# Patient Record
Sex: Male | Born: 1965 | Race: Asian | Hispanic: No | Marital: Married | Smoking: Never smoker
Health system: Southern US, Community
[De-identification: ages and names within clinical notes are randomized; demographics above are authoritative.]

---

## 2009-11-29 ENCOUNTER — Emergency Department (HOSPITAL_COMMUNITY): Admission: EM | Admit: 2009-11-29 | Discharge: 2009-11-29 | Payer: Self-pay | Admitting: Emergency Medicine

## 2010-03-22 LAB — POCT URINALYSIS DIPSTICK
Bilirubin Urine: NEGATIVE
Glucose, UA: NEGATIVE mg/dL
Ketones, ur: NEGATIVE mg/dL
Specific Gravity, Urine: <=1.005 (ref 1.005–1.030)

## 2011-01-17 ENCOUNTER — Emergency Department (INDEPENDENT_AMBULATORY_CARE_PROVIDER_SITE_OTHER)
Admission: EM | Admit: 2011-01-17 | Discharge: 2011-01-17 | Disposition: A | Discharge status: Home / Self Care | Payer: Medicaid Other | Source: Home / Self Care | Attending: Family Medicine | Admitting: Family Medicine

## 2011-01-17 ENCOUNTER — Emergency Department (INDEPENDENT_AMBULATORY_CARE_PROVIDER_SITE_OTHER): Payer: Medicaid Other

## 2011-01-17 ENCOUNTER — Encounter: Payer: Self-pay | Admitting: *Deleted

## 2011-01-17 DIAGNOSIS — S62009A Unspecified fracture of navicular [scaphoid] bone of unspecified wrist, initial encounter for closed fracture: Secondary | ICD-10-CM

## 2011-01-17 MED ORDER — HYDROCODONE-ACETAMINOPHEN 5-325 MG PO TABS: ORAL_TABLET | ORAL | Status: AC

## 2011-01-17 NOTE — ED Notes (Signed)
Pt  reports  Yesterday  He  Larey Seat   And  Injured  His  l  Hand  There  Is  Swelling  Present          He  Has  Abrasion  Present to  The  Affected  Hand      And  He  Has pain     Present

## 2011-01-17 NOTE — ED Provider Notes (Signed)
History     CSN: [id number]  Arrival date & time 01/17/11  1644   First MD Initiated Contact with Patient 01/17/11 1651      Chief Complaint  Patient presents with  . Fall    (Consider location/radiation/quality/duration/timing/severity/associated sxs/prior treatment) HPI Comments: Kent Anderson presents for evaluation of pain and swelling in his LEFT hand and wrist. He reports falling down his stairs yesterday, striking his hand against the wall, but not falling on it. He reports persistent pain with movement of his wrist and hand. He denies any numbness, tingling, or weakness.   Patient is a 46 y.o. male presenting with hand injury. The history is provided by the patient. A language interpreter was used (His friend is interpreting for him).  Hand Injury  The incident occurred yesterday. The incident occurred at home. The injury mechanism was a fall. The pain is present in the left hand and left wrist. The pain is moderate. The pain has been constant since the incident. The symptoms are aggravated by movement, use and palpation.    History reviewed. No pertinent past medical history.  History reviewed. No pertinent past surgical history.  History reviewed. No pertinent family history.  History  Substance Use Topics  . Smoking status: Not on file  . Smokeless tobacco: Not on file  . Alcohol Use: Not on file      Review of Systems  Constitutional: Negative.   HENT: Negative.   Eyes: Negative.   Respiratory: Negative.   Cardiovascular: Negative.   Gastrointestinal: Negative.   Genitourinary: Negative.   Musculoskeletal: Positive for myalgias and arthralgias.       LEFT hand and wrist pain  Skin: Positive for wound.       Abrasions over dorsum of LEFT hand  Neurological: Negative.     Allergies  Review of patient's allergies indicates no known allergies.  Home Medications   Current Outpatient Rx  Name Route Sig Dispense Refill  . HYDROCODONE-ACETAMINOPHEN 5-325 MG PO TABS   Take one to two tablets every 4 to 6 hours as needed for pain 20 tablet 0    BP 132/70  Pulse 70  Temp(Src) 98.6 F (37 C) (Oral)  Resp 18  SpO2 100%  Physical Exam  Nursing note and vitals reviewed. Constitutional: He is oriented to person, place, and time. He appears well-developed and well-nourished.  HENT:  Head: Normocephalic and atraumatic.  Eyes: EOM are normal.  Neck: Normal range of motion.  Pulmonary/Chest: Effort normal.  Musculoskeletal: Normal range of motion.       Left wrist: He exhibits tenderness and bony tenderness. He exhibits normal range of motion and no deformity.       Arms:      Left hand: He exhibits tenderness and swelling. He exhibits normal range of motion, no bony tenderness, normal capillary refill and no deformity.  Neurological: He is alert and oriented to person, place, and time.  Skin: Skin is warm and dry. Abrasion noted.     Psychiatric: His behavior is normal.    ED Course  Procedures (including critical care time)  Labs Reviewed - No data to display Dg Wrist Complete Left  01/17/2011  *RADIOLOGY REPORT*  Clinical Data: Fall.  Pain.  LEFT WRIST - COMPLETE 3+ VIEW  Comparison: None.  Findings: On a single projection, possibility of subtle scaphoid waist fracture is raised.  IMPRESSION: On a single projection, possibility of subtle scaphoid waist fracture is raised  Original Report Authenticated By: Fuller Canada, M.D.  1. Scaphoid fracture of wrist       MDM  Xray reviewed by radiologist and myself; no acute findings or fracture        Richardo Priest, MD 01/17/11 2211

## 2011-05-11 ENCOUNTER — Encounter (HOSPITAL_COMMUNITY): Payer: Self-pay | Admitting: Emergency Medicine

## 2011-05-11 ENCOUNTER — Emergency Department (HOSPITAL_COMMUNITY)
Admission: EM | Admit: 2011-05-11 | Discharge: 2011-05-11 | Disposition: A | Discharge status: Home / Self Care | Payer: Medicaid Other | Source: Home / Self Care | Attending: Family Medicine | Admitting: Family Medicine

## 2011-05-11 DIAGNOSIS — R197 Diarrhea, unspecified: Secondary | ICD-10-CM

## 2011-05-11 DIAGNOSIS — R51 Headache: Secondary | ICD-10-CM

## 2011-05-11 MED ORDER — PROMETHAZINE-CODEINE 6.25-10 MG/5ML PO SYRP: 5.0000 mL | ORAL_SOLUTION | ORAL | Status: AC | PRN

## 2011-05-11 NOTE — ED Notes (Signed)
Gave patient warm blankets.

## 2011-05-11 NOTE — Discharge Instructions (Signed)
The medicine should calm you stomach and help your headache. Avoid caffeine and milk products. Follow the diet until normal stool. B.R.A.T. Diet Your doctor has recommended the B.R.A.T. diet for you or your child until the condition improves. This is often used to help control diarrhea and vomiting symptoms. If you or your child can tolerate clear liquids, you may have:  Bananas.   Rice.   Applesauce.   Toast (and other simple starches such as crackers, potatoes, noodles).  Be sure to avoid dairy products, meats, and fatty foods until symptoms are better. Fruit juices such as apple, grape, and prune juice can make diarrhea worse. Avoid these. Continue this diet for 2 days or as instructed by your caregiver. Document Released: 12/26/2004 Document Revised: 12/15/2010 Document Reviewed: 06/14/2006 [facility] Patient Information 2012 [place], [place].

## 2011-05-11 NOTE — ED Provider Notes (Signed)
History     CSN: [id number]  Arrival date & time 05/11/11  1228   First MD Initiated Contact with Patient 05/11/11 1454      Chief Complaint  Patient presents with  . Diarrhea    (Consider location/radiation/quality/duration/timing/severity/associated sxs/prior treatment) HPI Comments: The patient reports having a ha and diarrhea since yesterday. Has another family member here with similar symptoms that started 4 days ago. No blood in stool. Nausea but no vomiting. Thinks he has had fever but has not measured. No tx pta. No cough , no sore throat , no cold symptoms.   The history is provided by the patient.    History reviewed. No pertinent past medical history.  History reviewed. No pertinent past surgical history.  History reviewed. No pertinent family history.  History  Substance Use Topics  . Smoking status: Current Everyday Smoker  . Smokeless tobacco: Not on file  . Alcohol Use: Yes      Review of Systems  Constitutional: Negative.   Respiratory: Negative.   Cardiovascular: Negative.   Gastrointestinal: Positive for nausea and diarrhea. Negative for vomiting.  Musculoskeletal: Positive for arthralgias.  Skin: Negative.   Neurological: Positive for headaches. Negative for dizziness.    Allergies  Review of patient's allergies indicates no known allergies.  Home Medications   Current Outpatient Rx  Name Route Sig Dispense Refill  . PROMETHAZINE-CODEINE 6.25-10 MG/5ML PO SYRP Oral Take 5 mLs by mouth every 4 (four) hours as needed for cough. 120 mL 0    BP 115/78  Pulse 102  Temp(Src) 98.5 F (36.9 C) (Oral)  Resp 14  SpO2 100%  Physical Exam  Nursing note and vitals reviewed. Constitutional: He is oriented to person, place, and time. He appears well-developed and well-nourished. No distress.  HENT:  Head: Normocephalic and atraumatic.  Nose: Nose normal.  Mouth/Throat: No oropharyngeal exudate.  Neck: Normal range of motion. Neck supple.  Thyromegaly present.  Cardiovascular: Normal rate, regular rhythm and normal heart sounds.   Pulmonary/Chest: Effort normal and breath sounds normal.  Abdominal: Soft. Bowel sounds are normal. He exhibits no distension. There is no tenderness. There is no rebound and no guarding.  Lymphadenopathy:    He has no cervical adenopathy.  Neurological: He is alert and oriented to person, place, and time.  Skin: Skin is warm and dry.    ED Course  Procedures (including critical care time)  Labs Reviewed - No data to display No results found.   1. Diarrhea   2. Headache       MDM          Randa Spike, MD 05/11/11 1531

## 2011-05-11 NOTE — ED Notes (Signed)
Fever, diarrhea, abdominal pain, and headache.  Onset yesterday morning.  No vomiting

## 2015-08-19 ENCOUNTER — Encounter (HOSPITAL_COMMUNITY): Payer: Self-pay | Admitting: Emergency Medicine

## 2015-08-19 DIAGNOSIS — F17200 Nicotine dependence, unspecified, uncomplicated: Secondary | ICD-10-CM | POA: Insufficient documentation

## 2015-08-19 DIAGNOSIS — R1031 Right lower quadrant pain: Secondary | ICD-10-CM | POA: Diagnosis present

## 2015-08-19 DIAGNOSIS — K353 Acute appendicitis with localized peritonitis: Secondary | ICD-10-CM | POA: Diagnosis not present

## 2015-08-19 LAB — CBC
HCT: 44.3 % (ref 39.0–52.0)
Hemoglobin: 15.5 g/dL (ref 13.0–17.0)
MCH: 30.3 pg (ref 26.0–34.0)
MCHC: 35 g/dL (ref 30.0–36.0)
MCV: 86.7 fL (ref 78.0–100.0)
Platelets: 168 10*3/uL (ref 150–400)
RBC: 5.11 MIL/uL (ref 4.22–5.81)
RDW: 12 % (ref 11.5–15.5)
WBC: 7.9 10*3/uL (ref 4.0–10.5)

## 2015-08-19 LAB — URINALYSIS, ROUTINE W REFLEX MICROSCOPIC
BILIRUBIN URINE: NEGATIVE
Glucose, UA: NEGATIVE mg/dL
HGB URINE DIPSTICK: NEGATIVE
Ketones, ur: NEGATIVE mg/dL
Leukocytes, UA: NEGATIVE
Nitrite: NEGATIVE
Protein, ur: NEGATIVE mg/dL
SPECIFIC GRAVITY, URINE: 1.016 (ref 1.005–1.030)
pH: 7 (ref 5.0–8.0)

## 2015-08-19 NOTE — ED Triage Notes (Signed)
Pt. reports mid/low abdominal pain onset yesterday , denies emesis or diarrhea , no fever or chills.

## 2015-08-20 ENCOUNTER — Encounter (HOSPITAL_COMMUNITY): Payer: Self-pay | Admitting: General Practice

## 2015-08-20 ENCOUNTER — Observation Stay (HOSPITAL_COMMUNITY)
Admission: EM | Admit: 2015-08-20 | Discharge: 2015-08-21 | Disposition: A | Discharge status: Home / Self Care | Payer: BLUE CROSS/BLUE SHIELD | Attending: Surgery | Admitting: Surgery

## 2015-08-20 ENCOUNTER — Observation Stay (HOSPITAL_COMMUNITY): Payer: BLUE CROSS/BLUE SHIELD | Admitting: Anesthesiology

## 2015-08-20 ENCOUNTER — Encounter (HOSPITAL_COMMUNITY)
Admission: EM | Disposition: A | Discharge status: Home / Self Care | Payer: Self-pay | Source: Home / Self Care | Attending: Emergency Medicine

## 2015-08-20 ENCOUNTER — Emergency Department (HOSPITAL_COMMUNITY): Payer: BLUE CROSS/BLUE SHIELD

## 2015-08-20 DIAGNOSIS — K3580 Unspecified acute appendicitis: Secondary | ICD-10-CM | POA: Diagnosis present

## 2015-08-20 HISTORY — PX: LAPAROSCOPIC APPENDECTOMY: SHX408

## 2015-08-20 HISTORY — PX: APPENDECTOMY: SHX54

## 2015-08-20 LAB — COMPREHENSIVE METABOLIC PANEL
ALT: 34 U/L (ref 17–63)
ANION GAP: 7 (ref 5–15)
AST: 24 U/L (ref 15–41)
Albumin: 3.9 g/dL (ref 3.5–5.0)
Alkaline Phosphatase: 37 U/L — ABNORMAL LOW (ref 38–126)
BILIRUBIN TOTAL: 1 mg/dL (ref 0.3–1.2)
BUN: 8 mg/dL (ref 6–20)
CHLORIDE: 102 mmol/L (ref 101–111)
CO2: 24 mmol/L (ref 22–32)
Calcium: 9.6 mg/dL (ref 8.9–10.3)
Creatinine, Ser: 1.04 mg/dL (ref 0.61–1.24)
Glucose, Bld: 152 mg/dL — ABNORMAL HIGH (ref 65–99)
POTASSIUM: 3.5 mmol/L (ref 3.5–5.1)
Sodium: 133 mmol/L — ABNORMAL LOW (ref 135–145)
TOTAL PROTEIN: 7.1 g/dL (ref 6.5–8.1)

## 2015-08-20 LAB — LIPASE, BLOOD: LIPASE: 34 U/L (ref 11–51)

## 2015-08-20 SURGERY — APPENDECTOMY, LAPAROSCOPIC
Anesthesia: General | Site: Abdomen

## 2015-08-20 MED ORDER — OXYCODONE HCL 5 MG/5ML PO SOLN: 5.0000 mg | Freq: Once | ORAL | Status: DC | PRN

## 2015-08-20 MED ORDER — METRONIDAZOLE IN NACL 5-0.79 MG/ML-% IV SOLN: 500.0000 mg | Freq: Three times a day (TID) | INTRAVENOUS | Status: DC

## 2015-08-20 MED ORDER — DIPHENHYDRAMINE HCL 50 MG/ML IJ SOLN: 12.5000 mg | Freq: Four times a day (QID) | INTRAMUSCULAR | Status: DC | PRN

## 2015-08-20 MED ORDER — DEXTROSE 5 % IV SOLN: 2.0000 g | INTRAVENOUS | Status: DC

## 2015-08-20 MED ORDER — ROCURONIUM BROMIDE 100 MG/10ML IV SOLN
INTRAVENOUS | Status: DC | PRN
  Administered 2015-08-20: 50 mg via INTRAVENOUS

## 2015-08-20 MED ORDER — LIDOCAINE 2% (20 MG/ML) 5 ML SYRINGE
INTRAMUSCULAR | Status: AC
  Filled 2015-08-20: qty 5

## 2015-08-20 MED ORDER — ONDANSETRON HCL 4 MG/2ML IJ SOLN
4.0000 mg | Freq: Once | INTRAMUSCULAR | Status: AC
  Administered 2015-08-20: 4 mg via INTRAVENOUS
  Filled 2015-08-20: qty 2

## 2015-08-20 MED ORDER — ONDANSETRON HCL 4 MG/2ML IJ SOLN: 4.0000 mg | Freq: Four times a day (QID) | INTRAMUSCULAR | Status: DC | PRN

## 2015-08-20 MED ORDER — OXYCODONE HCL 5 MG PO TABS
5.0000 mg | ORAL_TABLET | ORAL | Status: DC | PRN
  Administered 2015-08-20 – 2015-08-21 (×3): 10 mg via ORAL
  Filled 2015-08-20 (×3): qty 2

## 2015-08-20 MED ORDER — FAMOTIDINE IN NACL 20-0.9 MG/50ML-% IV SOLN
20.0000 mg | Freq: Two times a day (BID) | INTRAVENOUS | Status: DC
  Administered 2015-08-20: 20 mg via INTRAVENOUS
  Filled 2015-08-20 (×3): qty 50

## 2015-08-20 MED ORDER — 0.9 % SODIUM CHLORIDE (POUR BTL) OPTIME
TOPICAL | Status: DC | PRN
  Administered 2015-08-20: 1000 mL

## 2015-08-20 MED ORDER — SUGAMMADEX SODIUM 200 MG/2ML IV SOLN
INTRAVENOUS | Status: DC | PRN
  Administered 2015-08-20: 200 mg via INTRAVENOUS

## 2015-08-20 MED ORDER — SODIUM CHLORIDE 0.9 % IR SOLN
Status: DC | PRN
  Administered 2015-08-20: 1000 mL

## 2015-08-20 MED ORDER — HYDROMORPHONE HCL 1 MG/ML IJ SOLN: 0.2500 mg | INTRAMUSCULAR | Status: DC | PRN

## 2015-08-20 MED ORDER — FENTANYL CITRATE (PF) 250 MCG/5ML IJ SOLN
INTRAMUSCULAR | Status: AC
  Filled 2015-08-20: qty 5

## 2015-08-20 MED ORDER — LIDOCAINE HCL (CARDIAC) 20 MG/ML IV SOLN
INTRAVENOUS | Status: DC | PRN
  Administered 2015-08-20: 100 mg via INTRAVENOUS

## 2015-08-20 MED ORDER — CEFTRIAXONE SODIUM 2 G IJ SOLR
2.0000 g | INTRAMUSCULAR | Status: DC
  Administered 2015-08-21: 2 g via INTRAVENOUS
  Filled 2015-08-20: qty 2

## 2015-08-20 MED ORDER — DEXTROSE 5 % IV SOLN
2.0000 g | Freq: Once | INTRAVENOUS | Status: AC
  Administered 2015-08-20: 2 g via INTRAVENOUS
  Filled 2015-08-20: qty 2

## 2015-08-20 MED ORDER — OXYCODONE HCL 5 MG PO TABS: 5.0000 mg | ORAL_TABLET | Freq: Once | ORAL | Status: DC | PRN

## 2015-08-20 MED ORDER — ROCURONIUM BROMIDE 10 MG/ML (PF) SYRINGE
PREFILLED_SYRINGE | INTRAVENOUS | Status: AC
  Filled 2015-08-20: qty 10

## 2015-08-20 MED ORDER — SUGAMMADEX SODIUM 200 MG/2ML IV SOLN
INTRAVENOUS | Status: AC
  Filled 2015-08-20: qty 2

## 2015-08-20 MED ORDER — ONDANSETRON 4 MG PO TBDP: 4.0000 mg | ORAL_TABLET | Freq: Four times a day (QID) | ORAL | Status: DC | PRN

## 2015-08-20 MED ORDER — ONDANSETRON HCL 4 MG/2ML IJ SOLN
INTRAMUSCULAR | Status: DC | PRN
  Administered 2015-08-20: 4 mg via INTRAVENOUS

## 2015-08-20 MED ORDER — PROPOFOL 10 MG/ML IV BOLUS
INTRAVENOUS | Status: DC | PRN
  Administered 2015-08-20: 200 mg via INTRAVENOUS

## 2015-08-20 MED ORDER — BUPIVACAINE-EPINEPHRINE 0.25% -1:200000 IJ SOLN
INTRAMUSCULAR | Status: DC | PRN
  Administered 2015-08-20: 15 mL

## 2015-08-20 MED ORDER — FENTANYL CITRATE (PF) 100 MCG/2ML IJ SOLN
50.0000 ug | Freq: Once | INTRAMUSCULAR | Status: AC
  Administered 2015-08-20: 50 ug via INTRAVENOUS
  Filled 2015-08-20: qty 2

## 2015-08-20 MED ORDER — DIPHENHYDRAMINE HCL 12.5 MG/5ML PO ELIX: 12.5000 mg | ORAL_SOLUTION | Freq: Four times a day (QID) | ORAL | Status: DC | PRN

## 2015-08-20 MED ORDER — ENOXAPARIN SODIUM 40 MG/0.4ML ~~LOC~~ SOLN
40.0000 mg | SUBCUTANEOUS | Status: DC
  Filled 2015-08-20: qty 0.4

## 2015-08-20 MED ORDER — METRONIDAZOLE IN NACL 5-0.79 MG/ML-% IV SOLN
500.0000 mg | Freq: Three times a day (TID) | INTRAVENOUS | Status: DC
  Administered 2015-08-20: 500 mg via INTRAVENOUS

## 2015-08-20 MED ORDER — PROPOFOL 10 MG/ML IV BOLUS
INTRAVENOUS | Status: AC
  Filled 2015-08-20: qty 20

## 2015-08-20 MED ORDER — IOPAMIDOL (ISOVUE-300) INJECTION 61%
INTRAVENOUS | Status: AC
  Administered 2015-08-20: 100 mL
  Filled 2015-08-20: qty 100

## 2015-08-20 MED ORDER — HYDROMORPHONE HCL 1 MG/ML IJ SOLN: 1.0000 mg | INTRAMUSCULAR | Status: DC | PRN

## 2015-08-20 MED ORDER — LACTATED RINGERS IV SOLN
INTRAVENOUS | Status: DC
Start: 2015-08-20 — End: 2015-08-21
  Administered 2015-08-20 (×2): via INTRAVENOUS
  Administered 2015-08-20: 100 mL/h via INTRAVENOUS
  Administered 2015-08-21: 05:00:00 via INTRAVENOUS

## 2015-08-20 MED ORDER — MIDAZOLAM HCL 5 MG/5ML IJ SOLN
INTRAMUSCULAR | Status: DC | PRN
  Administered 2015-08-20: 1 mg via INTRAVENOUS

## 2015-08-20 MED ORDER — METRONIDAZOLE IN NACL 5-0.79 MG/ML-% IV SOLN
500.0000 mg | Freq: Once | INTRAVENOUS | Status: AC
  Administered 2015-08-20: 500 mg via INTRAVENOUS
  Filled 2015-08-20: qty 100

## 2015-08-20 MED ORDER — FENTANYL CITRATE (PF) 100 MCG/2ML IJ SOLN
INTRAMUSCULAR | Status: DC | PRN
  Administered 2015-08-20 (×2): 50 ug via INTRAVENOUS

## 2015-08-20 MED ORDER — MIDAZOLAM HCL 2 MG/2ML IJ SOLN
INTRAMUSCULAR | Status: AC
Start: 2015-08-20 — End: 2015-08-20
  Filled 2015-08-20: qty 2

## 2015-08-20 MED ORDER — BUPIVACAINE-EPINEPHRINE (PF) 0.25% -1:200000 IJ SOLN
INTRAMUSCULAR | Status: AC
  Filled 2015-08-20: qty 30

## 2015-08-20 MED ORDER — ONDANSETRON HCL 4 MG/2ML IJ SOLN: 4.0000 mg | Freq: Once | INTRAMUSCULAR | Status: DC | PRN

## 2015-08-20 MED ORDER — METRONIDAZOLE IN NACL 5-0.79 MG/ML-% IV SOLN
500.0000 mg | Freq: Three times a day (TID) | INTRAVENOUS | Status: AC
  Administered 2015-08-20: 500 mg via INTRAVENOUS
  Filled 2015-08-20 (×2): qty 100

## 2015-08-20 MED ORDER — MORPHINE SULFATE (PF) 2 MG/ML IV SOLN
1.0000 mg | INTRAVENOUS | Status: DC | PRN
  Administered 2015-08-20: 2 mg via INTRAVENOUS
  Filled 2015-08-20: qty 1

## 2015-08-20 MED ORDER — ACETAMINOPHEN 500 MG PO TABS
1000.0000 mg | ORAL_TABLET | Freq: Four times a day (QID) | ORAL | Status: DC
  Administered 2015-08-20 – 2015-08-21 (×2): 1000 mg via ORAL
  Filled 2015-08-20 (×2): qty 2

## 2015-08-20 MED ORDER — ONDANSETRON HCL 4 MG/2ML IJ SOLN
INTRAMUSCULAR | Status: AC
  Filled 2015-08-20: qty 2

## 2015-08-20 MED ORDER — MEPERIDINE HCL 25 MG/ML IJ SOLN: 6.2500 mg | INTRAMUSCULAR | Status: DC | PRN

## 2015-08-20 MED ORDER — METRONIDAZOLE IN NACL 5-0.79 MG/ML-% IV SOLN
500.0000 mg | INTRAVENOUS | Status: AC
  Filled 2015-08-20: qty 100

## 2015-08-20 SURGICAL SUPPLY — 51 items
APPLIER CLIP ROT 10 11.4 M/L (STAPLE)
BANDAGE ADH SHEER 1  50/CT (GAUZE/BANDAGES/DRESSINGS) ×3 IMPLANT
BENZOIN TINCTURE PRP APPL 2/3 (GAUZE/BANDAGES/DRESSINGS) ×3 IMPLANT
BLADE SURG ROTATE 9660 (MISCELLANEOUS) IMPLANT
CANISTER SUCTION 2500CC (MISCELLANEOUS) ×3 IMPLANT
CHLORAPREP W/TINT 26ML (MISCELLANEOUS) ×3 IMPLANT
CLIP APPLIE ROT 10 11.4 M/L (STAPLE) IMPLANT
CLOSURE WOUND 1/2 X4 (GAUZE/BANDAGES/DRESSINGS) ×1
COVER SURGICAL LIGHT HANDLE (MISCELLANEOUS) ×3 IMPLANT
CUTTER FLEX LINEAR 45M (STAPLE) ×3 IMPLANT
DERMABOND ADVANCED (GAUZE/BANDAGES/DRESSINGS)
DERMABOND ADVANCED .7 DNX12 (GAUZE/BANDAGES/DRESSINGS) IMPLANT
DRSG TEGADERM 2-3/8X2-3/4 SM (GAUZE/BANDAGES/DRESSINGS) ×3 IMPLANT
DRSG TEGADERM 4X4.75 (GAUZE/BANDAGES/DRESSINGS) IMPLANT
ELECT REM PT RETURN 9FT ADLT (ELECTROSURGICAL) ×3
ELECTRODE REM PT RTRN 9FT ADLT (ELECTROSURGICAL) ×1 IMPLANT
ENDOLOOP SUT PDS II  0 18 (SUTURE)
ENDOLOOP SUT PDS II 0 18 (SUTURE) IMPLANT
GAUZE SPONGE 2X2 8PLY STRL LF (GAUZE/BANDAGES/DRESSINGS) ×1 IMPLANT
GLOVE BIO SURGEON STRL SZ7 (GLOVE) ×3 IMPLANT
GLOVE BIO SURGEON STRL SZ7.5 (GLOVE) ×3 IMPLANT
GLOVE BIOGEL M STRL SZ7.5 (GLOVE) ×3 IMPLANT
GLOVE BIOGEL PI IND STRL 8 (GLOVE) ×1 IMPLANT
GLOVE BIOGEL PI INDICATOR 8 (GLOVE) ×2
GOWN STRL REUS W/ TWL LRG LVL3 (GOWN DISPOSABLE) ×2 IMPLANT
GOWN STRL REUS W/ TWL XL LVL3 (GOWN DISPOSABLE) ×1 IMPLANT
GOWN STRL REUS W/TWL LRG LVL3 (GOWN DISPOSABLE) ×4
GOWN STRL REUS W/TWL XL LVL3 (GOWN DISPOSABLE) ×2
KIT BASIN OR (CUSTOM PROCEDURE TRAY) ×3 IMPLANT
KIT ROOM TURNOVER OR (KITS) ×3 IMPLANT
NS IRRIG 1000ML POUR BTL (IV SOLUTION) ×3 IMPLANT
PAD ARMBOARD 7.5X6 YLW CONV (MISCELLANEOUS) ×6 IMPLANT
POUCH RETRIEVAL ECOSAC 10 (ENDOMECHANICALS) ×1 IMPLANT
POUCH RETRIEVAL ECOSAC 10MM (ENDOMECHANICALS) ×2
RELOAD 45 VASCULAR/THIN (ENDOMECHANICALS) IMPLANT
RELOAD STAPLE TA45 3.5 REG BLU (ENDOMECHANICALS) ×3 IMPLANT
SCALPEL HARMONIC ACE (MISCELLANEOUS) ×3 IMPLANT
SCISSORS LAP 5X35 DISP (ENDOMECHANICALS) IMPLANT
SET IRRIG TUBING LAPAROSCOPIC (IRRIGATION / IRRIGATOR) ×3 IMPLANT
SPECIMEN JAR SMALL (MISCELLANEOUS) ×3 IMPLANT
SPONGE GAUZE 2X2 STER 10/PKG (GAUZE/BANDAGES/DRESSINGS) ×2
STRIP CLOSURE SKIN 1/2X4 (GAUZE/BANDAGES/DRESSINGS) ×2 IMPLANT
SUT MNCRL AB 4-0 PS2 18 (SUTURE) ×3 IMPLANT
SUT VICRYL 0 UR6 27IN ABS (SUTURE) IMPLANT
TOWEL OR 17X24 6PK STRL BLUE (TOWEL DISPOSABLE) ×3 IMPLANT
TOWEL OR 17X26 10 PK STRL BLUE (TOWEL DISPOSABLE) ×3 IMPLANT
TRAY FOLEY CATH 16FR SILVER (SET/KITS/TRAYS/PACK) ×3 IMPLANT
TRAY LAPAROSCOPIC MC (CUSTOM PROCEDURE TRAY) ×3 IMPLANT
TROCAR XCEL BLADELESS 5X75MML (TROCAR) ×6 IMPLANT
TROCAR XCEL BLUNT TIP 100MML (ENDOMECHANICALS) ×3 IMPLANT
TUBING INSUFFLATION (TUBING) ×3 IMPLANT

## 2015-08-20 NOTE — Transfer of Care (Signed)
Immediate Anesthesia Transfer of Care Note  Patient: Kent Anderson  Procedure(s) Performed: Procedure(s): APPENDECTOMY LAPAROSCOPIC (N/A)  Patient Location: PACU  Anesthesia Type:General  Level of Consciousness: awake and alert   Airway & Oxygen Therapy: Patient Spontanous Breathing and Patient connected to face mask oxygen  Post-op Assessment: Report given to RN, Post -op Vital signs reviewed and stable and Patient moving all extremities  Post vital signs: Reviewed and stable  Last Vitals:  Vitals:   08/20/15 1108 08/20/15 1452  BP: 121/98 (P) 132/80  Pulse: 75   Resp: 18 (!) (P) 22  Temp:  (P) 36.7 C    Last Pain:  Vitals:   08/20/15 1108  TempSrc:   PainSc: 4          Complications: No apparent anesthesia complications

## 2015-08-20 NOTE — ED Notes (Addendum)
Pt ambulatory to and from bathroom to void, tolerated well.

## 2015-08-20 NOTE — ED Notes (Signed)
Pt to OR, daughter is accompanying pt to interpret.

## 2015-08-20 NOTE — Anesthesia Procedure Notes (Signed)
Procedure Name: Intubation Date/Time: 08/20/2015 1:49 PM Performed by: Fransisca KaufmannMEYER, Claritza July E Pre-anesthesia Checklist: Patient identified, Emergency Drugs available, Suction available and Patient being monitored Patient Re-evaluated:Patient Re-evaluated prior to inductionOxygen Delivery Method: Circle System Utilized Preoxygenation: Pre-oxygenation with 100% oxygen Intubation Type: IV induction Ventilation: Mask ventilation without difficulty Laryngoscope Size: Miller and 2 Grade View: Grade I Tube type: Oral Tube size: 7.0 mm Number of attempts: 1 Airway Equipment and Method: Stylet and Oral airway Placement Confirmation: ETT inserted through vocal cords under direct vision,  positive ETCO2 and breath sounds checked- equal and bilateral Secured at: 22 cm Tube secured with: Tape Dental Injury: Teeth and Oropharynx as per pre-operative assessment

## 2015-08-20 NOTE — Anesthesia Postprocedure Evaluation (Signed)
Anesthesia Post Note  Patient: Kent Anderson  Procedure(s) Performed: Procedure(s) (LRB): APPENDECTOMY LAPAROSCOPIC (N/A)  Patient location during evaluation: PACU Anesthesia Type: General Level of consciousness: awake and alert Pain management: pain level controlled Vital Signs Assessment: post-procedure vital signs reviewed and stable Respiratory status: spontaneous breathing, nonlabored ventilation and respiratory function stable Cardiovascular status: blood pressure returned to baseline and stable Postop Assessment: no signs of nausea or vomiting Anesthetic complications: no    Last Vitals:  Vitals:   08/20/15 1500 08/20/15 1515  BP: (!) 147/103 (!) 149/97  Pulse: 97 88  Resp: (!) 22 (!) 22  Temp:      Last Pain:  Vitals:   08/20/15 1452  TempSrc:   PainSc: Asleep                 Dorthy Magnussen A

## 2015-08-20 NOTE — ED Notes (Signed)
Belongings given to daughter  

## 2015-08-20 NOTE — Op Note (Signed)
Kent Anderson [id number] 06-Aug-1965 08/20/2015  Appendectomy, Lap, Procedure Note  Indications: The patient presented with a history of right-sided abdominal pain. A CT revealed findings consistent with acute appendicitis.  Pre-operative Diagnosis: acute appendicitis  Post-operative Diagnosis: Same  Surgeon: Atilano Ina   Assistants: Gray Bernhardt, RNFA  Anesthesia: General endotracheal anesthesia  Procedure Details  The patient was seen again in the Holding Room. The risks, benefits, complications, treatment options, and expected outcomes were discussed with the patient and/or family. The possibilities of perforation of viscus, bleeding, recurrent infection, the need for additional procedures, failure to diagnose a condition, and creating a complication requiring transfusion or operation were discussed. There was concurrence with the proposed plan and informed consent was obtained. The site of surgery was properly noted. The patient was taken to Operating Room, identified as Kent Anderson and the procedure verified as Appendectomy. A Time Out was held and the above information confirmed.  The patient was placed in the supine position and general anesthesia was induced, along with placement of orogastric tube, SCDs, and a Foley catheter. The abdomen was prepped and draped in a sterile fashion. A 1.5 centimeter infraumbilical incision was made.  The umbilical stalk was elevated, and the midline fascia was incised with a #11 blade.  A Kelly clamp was used to confirm entrance into the peritoneal cavity.  A pursestring suture was passed around the incision with a 0 Vicryl.  A 12mm Hasson was introduced into the abdomen and the tails of the suture were used to hold the Hasson in place.   The pneumoperitoneum was then established to steady pressure of 15 mmHg.  Additional 5 mm cannulas then placed in the left lower quadrant of the abdomen and the suprapubic region under direct visualization. A careful  evaluation of the entire abdomen was carried out. The patient was placed in Trendelenburg and left lateral decubitus position. The small intestines were retracted in the cephalad and left lateral direction away from the pelvis and right lower quadrant. The patient was found to have an inflammed appendix that was extending into the pelvis. There was no evidence of perforation. There was some light brown tinged fluid in the pelvix  The appendix was carefully dissected. The appendix was was skeletonized with the harmonic scalpel.   The appendix was divided at its base using an endo-GIA stapler with a blue load. No appendiceal stump was left in place. Staple line was inspected and was intact with 2 rows of staples. The appendix was removed from the abdomen with an Ecco bag through the umbilical port.  There was no evidence of bleeding, leakage, or complication after division of the appendix. Irrigation was also performed and irrigate suctioned from the abdomen as well.  The umbilical port site was closed with the purse string suture. The closure was viewed laparoscopically. There was no residual palpable fascial defect.  The trocar site skin wounds were closed with 4-0 Monocryl. Benzoin, steri-strips, bandages applied to the skin incisions.  Instrument, sponge, and needle counts were correct at the conclusion of the case.   Findings: The appendix was found to be inflamed. There were not signs of necrosis.  There was not perforation. There was not abscess formation.  Estimated Blood Loss:  Minimal         Drains: none         Specimens: appendix         Complications:  None; patient tolerated the procedure well.         Disposition:  PACU - hemodynamically stable.         Condition: stable  Mary SellaEric M. Andrey CampanileWilson, MD, FACS General, Bariatric, & Minimally Invasive Surgery [facility], [place]

## 2015-08-20 NOTE — Anesthesia Preprocedure Evaluation (Signed)
Anesthesia Evaluation  Patient identified by MRN, date of birth, ID band Patient awake    Reviewed: Allergy & Precautions, NPO status , Patient's Chart, lab work & pertinent test results  Airway Mallampati: I  TM Distance: >3 FB Neck ROM: Full    Dental  (+) Teeth Intact, Dental Advisory Given   Pulmonary Current Smoker,    breath sounds clear to auscultation       Cardiovascular  Rhythm:Regular Rate:Normal     Neuro/Psych    GI/Hepatic   Endo/Other    Renal/GU      Musculoskeletal   Abdominal   Peds  Hematology   Anesthesia Other Findings   Reproductive/Obstetrics                             Anesthesia Physical Anesthesia Plan  ASA: II  Anesthesia Plan: General   Post-op Pain Management:    Induction: Intravenous  Airway Management Planned: Oral ETT  Additional Equipment:   Intra-op Plan:   Post-operative Plan: Extubation in OR  Informed Consent: I have reviewed the patients History and Physical, chart, labs and discussed the procedure including the risks, benefits and alternatives for the proposed anesthesia with the patient or authorized representative who has indicated his/her understanding and acceptance.   Dental advisory given  Plan Discussed with: CRNA, Anesthesiologist and Surgeon  Anesthesia Plan Comments:         Anesthesia Quick Evaluation

## 2015-08-20 NOTE — ED Provider Notes (Signed)
[facility]-EMERGENCY DEPT Provider Note   CSN: [id number] Arrival date & time: 08/19/15  2306  First Provider Contact:   First MD Initiated Contact with Patient 08/20/15 0331     By signing my name below, I, Soijett Blue, attest that this documentation has been prepared under the direction and in the presence of Zadie Rhineonald Mischelle Reeg, MD. Electronically Signed: Soijett Blue, ED Scribe. 08/20/15. 3:50 AM.    History   Chief Complaint Chief Complaint  Patient presents with  . Abdominal Pain    HPI Kent Anderson is a 50 y.o. male who presents to the Emergency Department complaining of right lower abdominal pain onset 2 days. He states that he has tried OTC medications with no relief for his symptoms. He denies fever, vomiting, CP, SOB, difficulty urinating, back pain, and any other symptoms. Denies having abdominal surgery in the past.   The history is provided by the patient. A language interpreter was used [place]ali).  Abdominal Pain   This is a new problem. The current episode started 2 days ago. The problem occurs rarely. The problem has not changed since onset.The pain is associated with an unknown factor. The pain is moderate. Pertinent negatives include fever and vomiting. Nothing aggravates the symptoms. Nothing relieves the symptoms. Past workup does not include surgery.    History reviewed. No pertinent past medical history.  There are no active problems to display for this patient.   History reviewed. No pertinent surgical history.     Home Medications    Prior to Admission medications   Not on File    Family History No family history on file.  Social History Social History  Substance Use Topics  . Smoking status: Current Every Day Smoker  . Smokeless tobacco: Not on file  . Alcohol use Yes     Allergies   Review of patient's allergies indicates no known allergies.   Review of Systems Review of Systems  Constitutional: Negative for fever.  Gastrointestinal: Positive  for abdominal pain. Negative for vomiting.  All other systems reviewed and are negative.    Physical Exam Updated Vital Signs BP 122/99   Pulse 73   Temp 98 F (36.7 C) (Oral)   Resp 21   Ht 5\' 7"  (1.702 m)   Wt 174 lb (78.9 kg)   SpO2 99%   BMI 27.25 kg/m   Physical Exam  CONSTITUTIONAL: Well developed/well nourished HEAD: Normocephalic/atraumatic EYES: EOMI/PERRL ENMT: Mucous membranes moist NECK: supple no meningeal signs SPINE/BACK:entire spine nontender CV: S1/S2 noted, no murmurs/rubs/gallops noted LUNGS: Lungs are clear to auscultation bilaterally, no apparent distress ABDOMEN: soft, moderate RLQ tenderness, no rebound or guarding, bowel sounds noted throughout abdomen GU:no cva tenderness. No inguinal hernia noted. Chaperone present. NEURO: Pt is awake/alert/appropriate, moves all extremitiesx4.  No facial droop.   EXTREMITIES: pulses normal/equal, full ROM SKIN: warm, color normal PSYCH: no abnormalities of mood noted, alert and oriented to situation   ED Treatments / Results  DIAGNOSTIC STUDIES: Oxygen Saturation is 100% on RA, nl by my interpretation.    COORDINATION OF CARE: 3:45 AM Discussed treatment plan with pt at bedside which includes labs, UA, CT abdomen pelvis, fentanyl, zofran, and pt agreed to plan.   Labs (all labs ordered are listed, but only abnormal results are displayed) Labs Reviewed  COMPREHENSIVE METABOLIC PANEL - Abnormal; Notable for the following:       Result Value   Sodium 133 (*)    Glucose, Bld 152 (*)    Alkaline Phosphatase 37 (*)  All other components within normal limits  LIPASE, BLOOD  CBC  URINALYSIS, ROUTINE W REFLEX MICROSCOPIC (NOT AT [facility])    EKG  EKG Interpretation None       Radiology Ct Abdomen Pelvis W Contrast  Result Date: 08/20/2015 CLINICAL DATA:  Initial evaluation for acute right lower quadrant abdominal pain for 2 days. EXAM: CT ABDOMEN AND PELVIS WITH CONTRAST TECHNIQUE: Multidetector CT  imaging of the abdomen and pelvis was performed using the standard protocol following bolus administration of intravenous contrast. CONTRAST:  ISOVUE-300 IOPAMIDOL (ISOVUE-300) INJECTION 61% COMPARISON:  None. FINDINGS: Mild subsegmental atelectasis seen dependently within the visualized lung bases. Visualized lungs are otherwise clear. 5 mm subpleural nodule at the left lung base noted (series 205, image 15), indeterminate. Liver demonstrates a normal contrast enhanced appearance. Gallbladder within normal limits. No biliary dilatation. Spleen, adrenal glands, and pancreas demonstrate a normal contrast enhanced appearance. Kidneys are equal in size with symmetric enhancement. No nephrolithiasis, hydronephrosis, or focal enhancing renal mass. Stomach within normal limits. No evidence for bowel obstruction. Appendix visualized in the right lower quadrant and is dilated up to 12 mm with associated mucosal enhancement and inflammatory periappendiceal fat stranding, consistent with acute appendicitis. No evidence for perforation. No periappendiceal abscess. No other acute inflammatory changes about the bowels. Bladder well distended and normal in appearance. Prostate within normal limits. No free intraperitoneal air. Trace free fluid within the Roux right pericolic gutter related to the inflamed appendix. No pathologically enlarged intra-abdominal or pelvic lymph nodes. Normal intravascular enhancement seen throughout the intra-abdominal aorta and its branch vessels. No acute osseous abnormality. No worrisome lytic or blastic osseous lesions. Moderate degenerative spondylolysis noted at L4-5. IMPRESSION: 1. Findings consistent with acute appendicitis. No evidence for perforation or other complication. 2. 5 mm subpleural nodule within the left lung base, indeterminate. No follow-up needed if patient is low-risk. Non-contrast chest CT can be considered in 12 months if patient is high-risk. This recommendation  follows the consensus statement: Guidelines for Management of Incidental Pulmonary Nodules Detected on CT Images:From the Fleischner Society 2017; published online before print (10.1148/radiol.[id number]). Electronically Signed   By: Rise Mu M.D.   On: 08/20/2015 05:44    Procedures Procedures (including critical care time)  Medications Ordered in ED Medications  metroNIDAZOLE (FLAGYL) IVPB 500 mg (not administered)  cefTRIAXone (ROCEPHIN) 2 g in dextrose 5 % 50 mL IVPB (not administered)  fentaNYL (SUBLIMAZE) injection 50 mcg (not administered)  fentaNYL (SUBLIMAZE) injection 50 mcg (50 mcg Intravenous Given 08/20/15 0355)  ondansetron (ZOFRAN) injection 4 mg (4 mg Intravenous Given 08/20/15 0355)  iopamidol (ISOVUE-300) 61 % injection (100 mLs  Contrast Given 08/20/15 0446)     Initial Impression / Assessment and Plan / ED Course  I have reviewed the triage vital signs and the nursing notes.  Pertinent labs  results that were available during my care of the patient were reviewed by me and considered in my medical decision making (see chart for details).  Clinical Course     Interpreter number [phone] (nepali interpreter) Pt with abdominal pain CT imaging reveals acute appendicitis Pt stable D/w dr Janee Morn with gen surgery Will admit He requests flagyll and rocephin pre-op   Final Clinical Impressions(s) / ED Diagnoses   Final diagnoses:  Acute appendicitis, unspecified acute appendicitis type    New Prescriptions New Prescriptions   No medications on file   I personally performed the services described in this documentation, which was scribed in my presence. The recorded information has  been reviewed and is accurate.        Zadie Rhine, MD 08/20/15 [phone]

## 2015-08-20 NOTE — H&P (Signed)
[facility] Admission Note  Kent Anderson 09/06/1965  [id number].    Requesting MD: Dr. Ripley Fraise Chief Complaint/Reason for Consult: abdominal pain HPI:  50 y/o [place] male with no significant PMH who presented to [facility] with RLQ pain that started 2 days ago. The abdominal pain is described as sharp. Pain was peri-umbilical and then migrated to the RLQ. Initially at 10/10 and is now a 5/10 after receiving meds in ED. Patient states nothing makes the pain better or worse - he has tried taking an OTC medication from [facility]. Denis fever, chills, nausea, vomiting, recent illness or travel. Reports increased urinary frequency for 2 days but denies dysuria and hematuria. He denies changes is stool frequency/caliber. He has never had surgery in the past. NKDA. Denies regular medication use. He works in [employer] where he stands for 10 hours at a time and lifts 20-40 lbs. His last meal was dinner yesterday and he had some water early this morning.  CT scan performed in ED significant for findings consistent with acute appendicitis without perforation and general surgery was asked to consult.  ROS: All systems reviewed and otherwise negative except for as above  No family history on file.  History reviewed. No pertinent past medical history.  History reviewed. No pertinent surgical history.  Social History:  reports that he has been smoking.  He does not have any smokeless tobacco history on file. He reports that he drinks alcohol. He reports that he does not use drugs.  Allergies: No Known Allergies   (Not in a hospital admission)  Blood pressure 119/87, pulse 66, temperature 98 F (36.7 C), temperature source Oral, resp. rate 18, height _0  (1.702 m), weight 78.9 kg (174 lb), SpO2 96 %. Physical Exam: General: pleasant, overweight [place]ali male who is laying in bed in NAD HEENT: head is normocephalic, atraumatic. Mouth is pink, posterior tongue is black. Heart: regular,  rate, and rhythm.  No obvious murmurs, gallops, or rubs noted.  Palpable pedal pulses bilaterally Lungs: CTAB, no wheezes, rhonchi, or rales noted.  Respiratory effort nonlabored Abd: soft, TTP RLQ, +guarding, +Murphy's, ND, +BS, no masses, hernias, or organomegaly MS: all 4 extremities are symmetrical with no cyanosis, clubbing, or edema. Skin: warm and dry with no masses, lesions, or rashes Psych: A&Ox3 with an appropriate affect. Neuro: normal speech  Results for orders placed or performed during the hospital encounter of 08/20/15 (from the past 48 hour(s))  Urinalysis, Routine w reflex microscopic     Status: None   Collection Time: 08/19/15 11:05 PM  Result Value Ref Range   Color, Urine YELLOW YELLOW   APPearance CLEAR CLEAR   Specific Gravity, Urine 1.016 1.005 - 1.030   pH 7.0 5.0 - 8.0   Glucose, UA NEGATIVE NEGATIVE mg/dL   Hgb urine dipstick NEGATIVE NEGATIVE   Bilirubin Urine NEGATIVE NEGATIVE   Ketones, ur NEGATIVE NEGATIVE mg/dL   Protein, ur NEGATIVE NEGATIVE mg/dL   Nitrite NEGATIVE NEGATIVE   Leukocytes, UA NEGATIVE NEGATIVE    Comment: MICROSCOPIC NOT DONE ON URINES WITH NEGATIVE PROTEIN, BLOOD, LEUKOCYTES, NITRITE, OR GLUCOSE <1000 mg/dL.  Lipase, blood     Status: None   Collection Time: 08/19/15 11:19 PM  Result Value Ref Range   Lipase 34 11 - 51 U/L  Comprehensive metabolic panel     Status: Abnormal   Collection Time: 08/19/15 11:19 PM  Result Value Ref Range   Sodium 133 (L) 135 - 145 mmol/L   Potassium 3.5 3.5 - 5.1  mmol/L   Chloride 102 101 - 111 mmol/L   CO2 24 22 - 32 mmol/L   Glucose, Bld 152 (H) 65 - 99 mg/dL   BUN 8 6 - 20 mg/dL   Creatinine, Ser 1.04 0.61 - 1.24 mg/dL   Calcium 9.6 8.9 - 10.3 mg/dL   Total Protein 7.1 6.5 - 8.1 g/dL   Albumin 3.9 3.5 - 5.0 g/dL   AST 24 15 - 41 U/L   ALT 34 17 - 63 U/L   Alkaline Phosphatase 37 (L) 38 - 126 U/L   Total Bilirubin 1.0 0.3 - 1.2 mg/dL   GFR calc non Af Amer >60 >60 mL/min   GFR calc Af Amer  >60 >60 mL/min    Comment: (NOTE) The eGFR has been calculated using the CKD EPI equation. This calculation has not been validated in all clinical situations. eGFR's persistently <60 mL/min signify possible Chronic Kidney Disease.    Anion gap 7 5 - 15  CBC     Status: None   Collection Time: 08/19/15 11:19 PM  Result Value Ref Range   WBC 7.9 4.0 - 10.5 K/uL   RBC 5.11 4.22 - 5.81 MIL/uL   Hemoglobin 15.5 13.0 - 17.0 g/dL   HCT 44.3 39.0 - 52.0 %   MCV 86.7 78.0 - 100.0 fL   MCH 30.3 26.0 - 34.0 pg   MCHC 35.0 30.0 - 36.0 g/dL   RDW 12.0 11.5 - 15.5 %   Platelets 168 150 - 400 K/uL   Ct Abdomen Pelvis W Contrast  Result Date: 08/20/2015 CLINICAL DATA:  Initial evaluation for acute right lower quadrant abdominal pain for 2 days. EXAM: CT ABDOMEN AND PELVIS WITH CONTRAST TECHNIQUE: Multidetector CT imaging of the abdomen and pelvis was performed using the standard protocol following bolus administration of intravenous contrast. CONTRAST:  160m ISOVUE-300 IOPAMIDOL (ISOVUE-300) INJECTION 61% COMPARISON:  None. FINDINGS: Mild subsegmental atelectasis seen dependently within the visualized lung bases. Visualized lungs are otherwise clear. 5 mm subpleural nodule at the left lung base noted (series 205, image 15), indeterminate. Liver demonstrates a normal contrast enhanced appearance. Gallbladder within normal limits. No biliary dilatation. Spleen, adrenal glands, and pancreas demonstrate a normal contrast enhanced appearance. Kidneys are equal in size with symmetric enhancement. No nephrolithiasis, hydronephrosis, or focal enhancing renal mass. Stomach within normal limits. No evidence for bowel obstruction. Appendix visualized in the right lower quadrant and is dilated up to 12 mm with associated mucosal enhancement and inflammatory periappendiceal fat stranding, consistent with acute appendicitis. No evidence for perforation. No periappendiceal abscess. No other acute inflammatory changes about  the bowels. Bladder well distended and normal in appearance. Prostate within normal limits. No free intraperitoneal air. Trace free fluid within the Roux right pericolic gutter related to the inflamed appendix. No pathologically enlarged intra-abdominal or pelvic lymph nodes. Normal intravascular enhancement seen throughout the intra-abdominal aorta and its branch vessels. No acute osseous abnormality. No worrisome lytic or blastic osseous lesions. Moderate degenerative spondylolysis noted at L4-5. IMPRESSION: 1. Findings consistent with acute appendicitis. No evidence for perforation or other complication. 2. 5 mm subpleural nodule within the left lung base, indeterminate. No follow-up needed if patient is low-risk. Non-contrast chest CT can be considered in 12 months if patient is high-risk. This recommendation follows the consensus statement: Guidelines for Management of Incidental Pulmonary Nodules Detected on CT Images:From the Fleischner Society 2017; published online before print (10.1148/[id number]. Electronically Signed   By: BJeannine BogaM.D.   On: 08/20/2015 05:44  Assessment/Plan Acute appendicitis - afebrile, no leukocytosis  - very tender on exam  FEN: NPO, IVF ID: Rocephin/Flagyl peri-operatively DVT Proph: SCD's  Plan: to OR for laparoscopic appendectomy   Jill Alexanders, [facility] 08/20/2015, 8:26 AM Pager: [phone] Consults: [phone] Mon-Fri 7:00 am-4:30 pm Sat-Sun 7:00 am-11:30 am

## 2015-08-20 NOTE — ED Notes (Signed)
Attempted report 

## 2015-08-21 ENCOUNTER — Encounter: Payer: Self-pay | Admitting: General Surgery

## 2015-08-21 MED ORDER — OXYCODONE HCL 5 MG PO TABS: 5.0000 mg | ORAL_TABLET | Freq: Four times a day (QID) | ORAL | 0 refills | Status: AC | PRN

## 2015-08-21 MED ORDER — POLYETHYLENE GLYCOL 3350 17 G PO PACK: 17.0000 g | PACK | Freq: Two times a day (BID) | ORAL | Status: DC | PRN

## 2015-08-21 MED ORDER — POLYETHYLENE GLYCOL 3350 17 G PO PACK: 17.0000 g | PACK | Freq: Two times a day (BID) | ORAL | 0 refills | Status: AC | PRN

## 2015-08-21 NOTE — Progress Notes (Signed)
Discharge instructions gone over with patient and family. Home medications gone over. Prescription given. Follow up appointment to be made. Work note given also. Diet, activity, and reasons to call the doctor discussed. Signs and symptoms of infection gone over. Incisional care discussed. Patient verbalized understanding of instructions.

## 2015-08-21 NOTE — Discharge Instructions (Signed)
LAPAROSCOPIC SURGERY: POST OP INSTRUCTIONS  1. DIET: Follow a light bland diet the first 24 hours after arrival home, such as soup, liquids, crackers, etc. Be sure to include lots of fluids daily. Avoid fast food or heavy meals as your are more likely to get nauseated. Eat a low fat the next few days after surgery.  2. Take your usually prescribed home medications unless otherwise directed. 3. PAIN CONTROL:  1. Pain is best controlled by a usual combination of three different methods TOGETHER:  1. Ice/Heat 2. Over the counter pain medication 3. Prescription pain medication 2. Most patients will experience some swelling and bruising around the incisions. Ice packs or heating pads (30-60 minutes up to 6 times a day) will help. Use ice for the first few days to help decrease swelling and bruising, then switch to heat to help relax tight/sore spots and speed recovery. Some people prefer to use ice alone, heat alone, alternating between ice & heat. Experiment to what works for you. Swelling and bruising can take several weeks to resolve.  3. It is helpful to take an over-the-counter pain medication regularly for the first few weeks. Choose one of the following that works best for you:  1. Naproxen (Aleve, etc) Two 220mg  tabs twice a day 2. Ibuprofen (Advil, etc) Three 200mg  tabs four times a day (every meal & bedtime) 3. Acetaminophen (Tylenol, etc) 500-650mg  four times a day (every meal & bedtime) 4. A prescription for pain medication (such as oxycodone, hydrocodone, etc) should be given to you upon discharge. Take your pain medication as prescribed.  1. If you are having problems/concerns with the prescription medicine (does not control pain, nausea, vomiting, rash, itching, etc), please call us [phone] to see if we need to switch you to a different pain medicine that will work better for you and/or control your side effect better. 2. If you need a refill on your pain medication, please  contact your pharmacy. They will contact our office to request authorization. Prescriptions will not be filled after 5 pm or on week-ends. 4. Avoid getting constipated. Between the surgery and the pain medications, it is common to experience some constipation. Increasing fluid intake and taking a fiber supplement (such as Metamucil, Citrucel, FiberCon, MiraLax, etc) 1-2 times a day regularly will usually help prevent this problem from occurring. A mild laxative (prune juice, Milk of Magnesia, MiraLax, etc) should be taken according to package directions if there are no bowel movements after 48 hours.  5. Watch out for diarrhea. If you have many loose bowel movements, simplify your diet to bland foods & liquids for a few days. Stop any stool softeners and decrease your fiber supplement. Switching to mild anti-diarrheal medications (Kayopectate, Pepto Bismol) can help. If this worsens or does not improve, please call us. 6. Wash / shower every day. You may shower over the dressings as they are waterproof. Continue to shower over incision(s) after the dressing is off. 7. Remove your waterproof bandages 5 days after surgery. You may leave the incision open to air. You may replace a dressing/Band-Aid to cover the incision for comfort if you wish.  8. ACTIVITIES as tolerated:  1. You may resume regular (light) daily activities beginning the next day--such as daily self-care, walking, climbing stairs--gradually increasing activities as tolerated. If you can walk 30 minutes without difficulty, it is safe to try more intense activity such as jogging, treadmill, bicycling, low-impact aerobics, swimming, etc. 2. Save the most intensive and strenuous activity  for last such as sit-ups, heavy lifting, contact sports, etc Refrain from any heavy lifting or straining until you are off narcotics for pain control.  3. DO NOT PUSH THROUGH PAIN. Let pain be your guide: If it hurts to do something, don't do it. Pain is your body  warning you to avoid that activity for another week until the pain goes down. 4. You may drive when you are no longer taking prescription pain medication, you can comfortably wear a seatbelt, and you can safely maneuver your car and apply brakes. 5. You may have sexual intercourse when it is comfortable.  9. FOLLOW UP in our office  1. Please call [facility] at [phone] to set up an appointment to see your surgeon in the office for a follow-up appointment approximately 2-3 weeks after your surgery. 2. Make sure that you call for this appointment the day you arrive home to insure a convenient appointment time.      10. IF YOU HAVE DISABILITY OR FAMILY LEAVE FORMS, BRING THEM TO THE               OFFICE FOR PROCESSING.   WHEN TO CALL us [phone]:  1. Poor pain control 2. Reactions / problems with new medications (rash/itching, nausea, etc)  3. Fever over 101.5 F (38.5 C) 4. Inability to urinate 5. Nausea and/or vomiting 6. Worsening swelling or bruising 7. Continued bleeding from incision. 8. Increased pain, redness, or drainage from the incision  The clinic staff is available to answer your questions during regular business hours (8:30am-5pm). Please dont hesitate to call and ask to speak to one of our nurses for clinical concerns.  If you have a medical emergency, go to the nearest emergency room or call 911.  A surgeon from [facility] is always on call at the [facility], [place]  [address], [place], [place] [address] ?  MAIN: [phone] ? TOLL FREE: [phone] ?  FAX [phone]  [email or web address] Appendicitis Appendicitis means the appendix is puffy (inflamed). You need to get medical help right away. Without help, your problems can get much worse. The appendix can develop a hole (perforation). A pocket of yellowish-white fluid (abscess) can leak from the appendix. This can make you very sick. SYMPTOMS    Pain around the belly button (navel). The pain later moves toward the lower right belly (abdomen). The pain may be strong and sharp.  Tenderness in the lower right belly. The pain feels worse if you cough or move suddenly.  Feeling sick to your stomach (nausea).  Throwing up (vomiting).  No desire to eat (loss of appetite).  Fever.  Having a hard time pooping (constipation).  Watery poop (diarrhea).  Generally not feeling well. TREATMENT  In most cases, surgery is done to take out the appendix. This is done as soon as possible. This surgery is called appendectomy. Most people go home in 24 to 48 hours after surgery. If the appendix has a hole, surgery might be delayed. Any yellowish-white fluid will be removed with a drain. A drain removes fluid from the body. You may be given antibiotic medicine that kills germs. This medicine is given through a tube in your vein (IV). You may still need surgery after the fluid has been drained. You may need to stay in the hospital longer than 48 hours.   This information is not intended to replace advice given to you by your health care provider.  Make sure you discuss any questions you have with your health care provider.   Document Released: 03/20/2011 Document Reviewed: 05/13/2014 Elsevier Interactive Patient Education [employer]2016 Elsevier Inc.

## 2015-08-21 NOTE — Discharge Summary (Signed)
[facility] Discharge Summary   Patient ID: Kent Anderson MRN: [id number] DOB/AGE: 14-Jun-1965 50 y.o.  Admit date: 08/20/2015 Discharge date: 08/21/2015  Admitting Diagnosis: Acute appendicitis  Discharge Diagnosis Patient Active Problem List   Diagnosis Date Noted  . Acute appendicitis 08/20/2015    Consultants N/A  Imaging: Ct Abdomen Pelvis W Contrast  Result Date: 08/20/2015 CLINICAL DATA:  Initial evaluation for acute right lower quadrant abdominal pain for 2 days. EXAM: CT ABDOMEN AND PELVIS WITH CONTRAST TECHNIQUE: Multidetector CT imaging of the abdomen and pelvis was performed using the standard protocol following bolus administration of intravenous contrast. CONTRAST:  ISOVUE-300 IOPAMIDOL (ISOVUE-300) INJECTION 61% COMPARISON:  None. FINDINGS: Mild subsegmental atelectasis seen dependently within the visualized lung bases. Visualized lungs are otherwise clear. 5 mm subpleural nodule at the left lung base noted (series 205, image 15), indeterminate. Liver demonstrates a normal contrast enhanced appearance. Gallbladder within normal limits. No biliary dilatation. Spleen, adrenal glands, and pancreas demonstrate a normal contrast enhanced appearance. Kidneys are equal in size with symmetric enhancement. No nephrolithiasis, hydronephrosis, or focal enhancing renal mass. Stomach within normal limits. No evidence for bowel obstruction. Appendix visualized in the right lower quadrant and is dilated up to 12 mm with associated mucosal enhancement and inflammatory periappendiceal fat stranding, consistent with acute appendicitis. No evidence for perforation. No periappendiceal abscess. No other acute inflammatory changes about the bowels. Bladder well distended and normal in appearance. Prostate within normal limits. No free intraperitoneal air. Trace free fluid within the Roux right pericolic gutter related to the inflamed appendix. No pathologically enlarged intra-abdominal or  pelvic lymph nodes. Normal intravascular enhancement seen throughout the intra-abdominal aorta and its branch vessels. No acute osseous abnormality. No worrisome lytic or blastic osseous lesions. Moderate degenerative spondylolysis noted at L4-5. IMPRESSION: 1. Findings consistent with acute appendicitis. No evidence for perforation or other complication. 2. 5 mm subpleural nodule within the left lung base, indeterminate. No follow-up needed if patient is low-risk. Non-contrast chest CT can be considered in 12 months if patient is high-risk. This recommendation follows the consensus statement: Guidelines for Management of Incidental Pulmonary Nodules Detected on CT Images:From the Fleischner Society 2017; published online before print (10.1148/radiol.[id number]). Electronically Signed   By: Rise Mu M.D.   On: 08/20/2015 05:44    Procedures Dr. Gaynelle Adu (08/20/15) - Laparoscopic Appendectomy  Hospital Course:  50 y/o [place] male who presented to [facility] with 2 days of abdominal pain. Workup showed acute appendicitis with abdominal tenderness and guarding on physical exam.  Patient was admitted and underwent procedure listed above.  Tolerated procedure well and was transferred to the floor.  Diet was advanced as tolerated.  On POD#1, the patient was voiding well, tolerating diet, ambulating well, pain well controlled, vital signs stable, incisions c/d/i and felt stable for discharge home.  Patient will follow up in our office in 2 weeks and knows to call with questions or concerns.    Physical Exam: General:  Alert, NAD, pleasant, comfortable Abd:  Soft, ND, mild tenderness, incisions C/D/I    Medication List    TAKE these medications   oxyCODONE 5 MG immediate release tablet Commonly known as:  Oxy IR/ROXICODONE Take 1-2 tablets (5-10 mg total) by mouth every 6 (six) hours as needed for moderate pain.   polyethylene glycol packet Commonly known as:  MIRALAX / GLYCOLAX Take 17 g by  mouth every 12 (twelve) hours as needed for mild constipation, moderate constipation or severe constipation.  Follow-up Information    [facility] [place]. Schedule an appointment as soon as possible for a visit in 2 week(s).   Specialty:  General Surgery Why:  for postoperative follow-up. Please arrive 30 minutes early to fill out check-in paperwork. Contact information: [address] [place] [id number] [phone]          Signed: Hosie SpangleElizabeth Cortni Tays, [facility] 08/21/2015, 8:23 AM Pager: [phone] Consults: [phone] Mon-Fri 7:00 am-4:30 pm Sat-Sun 7:00 am-11:30 am

## 2015-08-23 ENCOUNTER — Encounter (HOSPITAL_COMMUNITY): Payer: Self-pay | Admitting: General Surgery

## 2018-07-27 ENCOUNTER — Other Ambulatory Visit: Payer: Self-pay

## 2018-07-27 DIAGNOSIS — Z20822 Contact with and (suspected) exposure to covid-19: Secondary | ICD-10-CM

## 2018-07-31 LAB — NOVEL CORONAVIRUS, NAA: SARS-CoV-2, NAA: NOT DETECTED

## 2018-08-07 ENCOUNTER — Telehealth: Payer: Self-pay

## 2018-08-07 NOTE — Telephone Encounter (Signed)
Per pt. Request, mailed COVID results to home address.

## 2019-05-16 ENCOUNTER — Encounter: Payer: Self-pay | Admitting: Family Medicine

## 2019-05-16 ENCOUNTER — Other Ambulatory Visit: Payer: Self-pay

## 2019-05-16 ENCOUNTER — Ambulatory Visit (INDEPENDENT_AMBULATORY_CARE_PROVIDER_SITE_OTHER): Payer: 59 | Admitting: Family Medicine

## 2019-05-16 DIAGNOSIS — R7309 Other abnormal glucose: Secondary | ICD-10-CM

## 2019-05-16 DIAGNOSIS — R030 Elevated blood-pressure reading, without diagnosis of hypertension: Secondary | ICD-10-CM | POA: Diagnosis not present

## 2019-05-16 DIAGNOSIS — Z7689 Persons encountering health services in other specified circumstances: Secondary | ICD-10-CM | POA: Diagnosis not present

## 2019-05-16 NOTE — Assessment & Plan Note (Signed)
Patient had elevated blood glucose when he was in the hospital for his appendicitis.  Glucose was 152.  Patient has not had hemoglobin A1c. -Hemoglobin A1c checked today

## 2019-05-16 NOTE — Assessment & Plan Note (Signed)
Patient is here to establish care with a new provider.  Reports he has not been seen in many years.  Past medical history is only significant for appendicitis in 2017 which he received a appendectomy. -Lipid panel -Hemoglobin A1c -Ambulatory referral to gastroenterology for colonoscopy -BMP

## 2019-05-16 NOTE — Progress Notes (Signed)
    SUBJECTIVE:   CHIEF COMPLAINT / HPI:  New patient visit  Current concerns None at this time. Wants to make sure everything is OK. Has not seen physician in "a while".   PMH No known PMH   PSH Appendectomy in 2017  Family history Patient with maternal side thyroid cancer Paternal side asthma and heart disease.  Allergies No known drug allergies  Social history Lives at home with wife, son, daughter-in-law, daughter, son, granddaughter.  Started living there in 2014.  He has chewed tobacco since 1990.  Does not use recreational drugs.  Denies illicit drug use.  Reports occasional alcohol up use with drinking 1 beer daily.  No risk of sexual disease.  In his free time he likes to walk and get together with friends.  OBJECTIVE:   BP (!) 136/92   Pulse 71   Ht 5\' 7"  (1.702 m)   Wt 78.2 kg   SpO2 99%   BMI 27.00 kg/m   General: NAD HEENT: Atraumatic. Normocephalic. Normal TMs and ear canals bilaterally, Normal oropharynx without erythema, lesions, exudate.  Poor dentition Neck: No cervical lymphadenopathy.  Cardiac: RRR, no m/r/g Respiratory: CTAB, normal work of breathing Abdomen: soft, nontender, nondistended, bowel sounds normal Skin: warm and dry, no rashes noted Neuro: alert and oriented   ASSESSMENT/PLAN:   Encounter to establish care with new doctor Patient is here to establish care with a new provider.  Reports he has not been seen in many years.  Past medical history is only significant for appendicitis in 2017 which he received a appendectomy. -Lipid panel -Hemoglobin A1c -Ambulatory referral to gastroenterology for colonoscopy -BMP  Elevated random blood glucose level Patient had elevated blood glucose when he was in the hospital for his appendicitis.  Glucose was 152.  Patient has not had hemoglobin A1c. -Hemoglobin A1c checked today     2018, MD [facility]

## 2019-05-16 NOTE — Patient Instructions (Addendum)
It was a pleasure to meet you today.  Welcome to our clinic.  I am going to collect some lab work today to assess for diabetes with a hemoglobin A1c.  I would also like to check your cholesterol.  I put a referral into the gastroenterologist so that they can schedule a colonoscopy to assess for colon cancer.  I will post the lab results to your MyChart and if there are any abnormalities I will call you with those results.  If you have any issues, questions, or concerns please feel free to call the clinic and we can do our best to help you out.  Have a wonderful afternoon!

## 2019-05-17 LAB — BASIC METABOLIC PANEL
BUN/Creatinine Ratio: 9 (ref 9–20)
BUN: 10 mg/dL (ref 6–24)
CO2: 24 mmol/L (ref 20–29)
Calcium: 9.6 mg/dL (ref 8.7–10.2)
Chloride: 104 mmol/L (ref 96–106)
Creatinine, Ser: 1.09 mg/dL (ref 0.76–1.27)
GFR calc Af Amer: 87 mL/min/{1.73_m2} (ref >59)
GFR calc non Af Amer: 75 mL/min/{1.73_m2} (ref >59)
Glucose: 86 mg/dL (ref 65–99)
Potassium: 3.8 mmol/L (ref 3.5–5.2)
Sodium: 142 mmol/L (ref 134–144)

## 2019-05-17 LAB — LIPID PANEL
Chol/HDL Ratio: 4.8 ratio (ref 0.0–5.0)
Cholesterol, Total: 227 mg/dL — ABNORMAL HIGH (ref 100–199)
HDL: 47 mg/dL (ref >39)
LDL Chol Calc (NIH): 148 mg/dL — ABNORMAL HIGH (ref 0–99)
Triglycerides: 177 mg/dL — ABNORMAL HIGH (ref 0–149)
VLDL Cholesterol Cal: 32 mg/dL (ref 5–40)

## 2019-05-17 LAB — HEMOGLOBIN A1C
Est. average glucose Bld gHb Est-mCnc: 108 mg/dL
Hgb A1c MFr Bld: 5.4 % (ref 4.8–5.6)

## 2019-06-20 ENCOUNTER — Encounter: Payer: Self-pay | Admitting: Gastroenterology

## 2019-08-08 ENCOUNTER — Ambulatory Visit (AMBULATORY_SURGERY_CENTER): Payer: Self-pay | Admitting: *Deleted

## 2019-08-08 ENCOUNTER — Other Ambulatory Visit: Payer: Self-pay

## 2019-08-08 VITALS — Ht 67.0 in | Wt 171.8 lb

## 2019-08-08 DIAGNOSIS — Z1211 Encounter for screening for malignant neoplasm of colon: Secondary | ICD-10-CM

## 2019-08-08 MED ORDER — SUPREP BOWEL PREP KIT 17.5-3.13-1.6 GM/177ML PO SOLN: 1.0000 | Freq: Once | ORAL | 0 refills | Status: AC

## 2019-08-08 NOTE — Progress Notes (Signed)
04-24-2019 comp covid vacc x 2   Nepali Interpreter in [facility]   No egg or soy allergy known to patient  No issues with past sedation with any surgeries or procedures no intubation problems in the past  No diet pills per patient No home 02 use per patient  No blood thinners per patient  Pt denies issues with constipation  No A fib or A flutter  EMMI video to pt or MyChart  COVID 19 guidelines implemented in [facility] today    Due to the COVID-19 pandemic we are asking patients to follow these guidelines. Please only bring one care partner. Please be aware that your care partner may wait in the car in the parking lot or if they feel like they will be too hot to wait in the car, they may wait in the lobby on the 4th floor. All care partners are required to wear a mask the entire time (we do not have any that we can provide them), they need to practice social distancing, and we will do a Covid check for all patient's and care partners when you arrive. Also we will check their temperature and your temperature. If the care partner waits in their car they need to stay in the parking lot the entire time and we will call them on their cell phone when the patient is ready for discharge so they can bring the car to the front of the building. Also all patient's will need to wear a mask into building.

## 2019-08-22 ENCOUNTER — Encounter: Payer: Self-pay | Admitting: Gastroenterology

## 2019-08-22 ENCOUNTER — Other Ambulatory Visit: Payer: Self-pay

## 2019-08-22 ENCOUNTER — Ambulatory Visit (AMBULATORY_SURGERY_CENTER): Payer: 59 | Admitting: Gastroenterology

## 2019-08-22 VITALS — BP 110/65 | HR 59 | Temp 97.8°F | Resp 16 | Ht 67.0 in | Wt 171.0 lb

## 2019-08-22 DIAGNOSIS — Z1211 Encounter for screening for malignant neoplasm of colon: Secondary | ICD-10-CM

## 2019-08-22 MED ORDER — SODIUM CHLORIDE 0.9 % IV SOLN: 500.0000 mL | Freq: Once | INTRAVENOUS | Status: DC

## 2019-08-22 NOTE — Progress Notes (Signed)
Report to PACU, RN, vss, BBS= Clear.  

## 2019-08-22 NOTE — Progress Notes (Signed)
Pt's states no medical or surgical changes since previsit or office visit. 

## 2019-08-22 NOTE — Patient Instructions (Signed)
Resume previous diet  continue current medications Repeat colonoscopy in 10 years   YOU HAD AN ENDOSCOPIC PROCEDURE TODAY AT THE [facility]:   Refer to the procedure report that was given to you for any specific questions about what was found during the examination.  If the procedure report does not answer your questions, please call your gastroenterologist to clarify.  If you requested that your care partner not be given the details of your procedure findings, then the procedure report has been included in a sealed envelope for you to review at your convenience later.  YOU SHOULD EXPECT: Some feelings of bloating in the abdomen. Passage of more gas than usual.  Walking can help get rid of the air that was put into your GI tract during the procedure and reduce the bloating. If you had a lower endoscopy (such as a colonoscopy or flexible sigmoidoscopy) you may notice spotting of blood in your stool or on the toilet paper. If you underwent a bowel prep for your procedure, you may not have a normal bowel movement for a few days.  Please Note:  You might notice some irritation and congestion in your nose or some drainage.  This is from the oxygen used during your procedure.  There is no need for concern and it should clear up in a day or so.  SYMPTOMS TO REPORT IMMEDIATELY:   Following lower endoscopy (colonoscopy or flexible sigmoidoscopy):  Excessive amounts of blood in the stool  Significant tenderness or worsening of abdominal pains  Swelling of the abdomen that is new, acute  Fever of 100F or higher   Following upper endoscopy (EGD)  Vomiting of blood or coffee ground material  New chest pain or pain under the shoulder blades  Painful or persistently difficult swallowing  New shortness of breath  Fever of 100F or higher  Black, tarry-looking stools  For urgent or emergent issues, a gastroenterologist can be reached at any hour by calling [phone]. Do not use  MyChart messaging for urgent concerns.    DIET:  We do recommend a small meal at first, but then you may proceed to your regular diet.  Drink plenty of fluids but you should avoid alcoholic beverages for 24 hours.  ACTIVITY:  You should plan to take it easy for the rest of today and you should NOT DRIVE or use heavy machinery until tomorrow (because of the sedation medicines used during the test).    FOLLOW UP: Our staff will call the number listed on your records 48-72 hours following your procedure to check on you and address any questions or concerns that you may have regarding the information given to you following your procedure. If we do not reach you, we will leave a message.  We will attempt to reach you two times.  During this call, we will ask if you have developed any symptoms of COVID 19. If you develop any symptoms (ie: fever, flu-like symptoms, shortness of breath, cough etc.) before then, please call [phone].  If you test positive for Covid 19 in the 2 weeks post procedure, please call and report this information to [place].    If any biopsies were taken you will be contacted by phone or by letter within the next 1-3 weeks.  Please call us at [phone] if you have not heard about the biopsies in 3 weeks.    SIGNATURES/CONFIDENTIALITY: You and/or your care partner have signed paperwork which will be entered into your electronic medical  record.  These signatures attest to the fact that that the information above on your After Visit Summary has been reviewed and is understood.  Full responsibility of the confidentiality of this discharge information lies with you and/or your care-partner.YOU HAD AN ENDOSCOPIC PROCEDURE TODAY AT THE [facility]:   Refer to the procedure report that was given to you for any specific questions about what was found during the examination.  If the procedure report does not answer your questions, please call your gastroenterologist to clarify.   If you requested that your care partner not be given the details of your procedure findings, then the procedure report has been included in a sealed envelope for you to review at your convenience later.  YOU SHOULD EXPECT: Some feelings of bloating in the abdomen. Passage of more gas than usual.  Walking can help get rid of the air that was put into your GI tract during the procedure and reduce the bloating. If you had a lower endoscopy (such as a colonoscopy or flexible sigmoidoscopy) you may notice spotting of blood in your stool or on the toilet paper. If you underwent a bowel prep for your procedure, you may not have a normal bowel movement for a few days.  Please Note:  You might notice some irritation and congestion in your nose or some drainage.  This is from the oxygen used during your procedure.  There is no need for concern and it should clear up in a day or so.  SYMPTOMS TO REPORT IMMEDIATELY:   Following lower endoscopy (colonoscopy or flexible sigmoidoscopy):  Excessive amounts of blood in the stool  Significant tenderness or worsening of abdominal pains  Swelling of the abdomen that is new, acute  Fever of 100F or higher   For urgent or emergent issues, a gastroenterologist can be reached at any hour by calling [phone]. Do not use MyChart messaging for urgent concerns.    DIET:  We do recommend a small meal at first, but then you may proceed to your regular diet.  Drink plenty of fluids but you should avoid alcoholic beverages for 24 hours.  ACTIVITY:  You should plan to take it easy for the rest of today and you should NOT DRIVE or use heavy machinery until tomorrow (because of the sedation medicines used during the test).    FOLLOW UP: Our staff will call the number listed on your records 48-72 hours following your procedure to check on you and address any questions or concerns that you may have regarding the information given to you following your procedure. If we do  not reach you, we will leave a message.  We will attempt to reach you two times.  During this call, we will ask if you have developed any symptoms of COVID 19. If you develop any symptoms (ie: fever, flu-like symptoms, shortness of breath, cough etc.) before then, please call [phone].  If you test positive for Covid 19 in the 2 weeks post procedure, please call and report this information to [place].    If any biopsies were taken you will be contacted by phone or by letter within the next 1-3 weeks.  Please call us at [phone] if you have not heard about the biopsies in 3 weeks.    SIGNATURES/CONFIDENTIALITY: You and/or your care partner have signed paperwork which will be entered into your electronic medical record.  These signatures attest to the fact that that the information above on your After Visit Summary  has been reviewed and is understood.  Full responsibility of the confidentiality of this discharge information lies with you and/or your care-partner. 

## 2019-08-22 NOTE — Op Note (Signed)
[facility] Patient Name: Kent Anderson Procedure Date: 08/22/2019 8:58 AM MRN: [id number] Endoscopist: Lynann Bologna , MD Age: 54 Referring MD:  Date of Birth: 1965/07/01 Gender: Male Account #: [id number] Procedure:                Colonoscopy Indications:              Screening for colorectal malignant neoplasm Medicines:                Monitored Anesthesia Care Procedure:                Pre-Anesthesia Assessment:                           - Prior to the procedure, a History and Physical                            was performed, and patient medications and                            allergies were reviewed. The patient's tolerance of                            previous anesthesia was also reviewed. The risks                            and benefits of the procedure and the sedation                            options and risks were discussed with the patient.                            All questions were answered, and informed consent                            was obtained. Prior Anticoagulants: The patient has                            taken no previous anticoagulant or antiplatelet                            agents. ASA Grade Assessment: II - A patient with                            mild systemic disease. After reviewing the risks                            and benefits, the patient was deemed in                            satisfactory condition to undergo the procedure.                           After obtaining informed consent, the colonoscope  was passed under direct vision. Throughout the                            procedure, the patient's blood pressure, pulse, and                            oxygen saturations were monitored continuously. The                            Colonoscope was introduced through the anus and                            advanced to the 2 cm into the ileum. The                            colonoscopy was performed without  difficulty. The                            patient tolerated the procedure well. The quality                            of the bowel preparation was good. The terminal                            ileum, ileocecal valve, appendiceal orifice, and                            rectum were photographed. Scope In: 9:02:50 AM Scope Out: 9:14:27 AM Scope Withdrawal Time: 0 hours 8 minutes 47 seconds  Total Procedure Duration: 0 hours 11 minutes 37 seconds  Findings:                 The colon (entire examined portion) appeared normal                            with well preserved vascular pattern.                           A few small-mouthed diverticula were found in the                            ascending colon and cecum.                           The terminal ileum appeared normal.                           The exam was otherwise without abnormality on                            direct and retroflexion views. Complications:            No immediate complications. Estimated Blood Loss:     Estimated blood loss: none. Impression:               -Mild right colonic diverticulosis.                           -  Otherwise normal colonoscopy to TI. Recommendation:           - Patient has a contact number available for                            emergencies. The signs and symptoms of potential                            delayed complications were discussed with the                            patient. Return to normal activities tomorrow.                            Written discharge instructions were provided to the                            patient.                           - Resume previous diet.                           - Continue present medications.                           - Repeat colonoscopy in 10 years for screening                            purposes. Earlier, if with any new problems or if                            there is any change in family history.                           - Return to  GI clinic PRN. Lynann Bologna, MD 08/22/2019 9:18:05 AM This report has been signed electronically.

## 2019-08-26 ENCOUNTER — Telehealth: Payer: Self-pay | Admitting: *Deleted

## 2019-08-26 NOTE — Telephone Encounter (Signed)
°  Follow up Call-  Call back number 08/22/2019  Post procedure Call Back phone  # Jackquline Berlin ok to call son [phone]  Permission to leave phone message Yes  Some recent data might be hidden     Patient questions:  Do you have a fever, pain , or abdominal swelling? NO Pain Score  0  Have you tolerated food without any problems?yes  Have you been able to return to your normal activities?yes  Do you have any questions about your discharge instructions: Diet   NO  Medications  NO Follow up visit  NO  Do you have questions or concerns about your Care? NO  Actions: * If pain score is 4 or above: No action needed, pain <4  1. Have you developed a fever since your procedure? NO  2.   Have you had an respiratory symptoms (SOB or cough) since your procedure? NO  3.   Have you tested positive for COVID 19 since your procedure NO  4.   Have you had any family members/close contacts diagnosed with the COVID 19 since your procedure?  NO   If yes to any of these questions please route to Laverna Peace, RN and Karlton Lemon, RN

## 2020-04-09 ENCOUNTER — Encounter: Payer: Self-pay | Admitting: Family Medicine

## 2020-04-09 ENCOUNTER — Ambulatory Visit (INDEPENDENT_AMBULATORY_CARE_PROVIDER_SITE_OTHER): Payer: 59 | Admitting: Family Medicine

## 2020-04-09 ENCOUNTER — Other Ambulatory Visit: Payer: Self-pay

## 2020-04-09 VITALS — BP 128/84 | HR 95 | Ht 67.0 in | Wt 169.0 lb

## 2020-04-09 DIAGNOSIS — E663 Overweight: Secondary | ICD-10-CM | POA: Diagnosis not present

## 2020-04-09 DIAGNOSIS — Z114 Encounter for screening for human immunodeficiency virus [HIV]: Secondary | ICD-10-CM

## 2020-04-09 DIAGNOSIS — Z1159 Encounter for screening for other viral diseases: Secondary | ICD-10-CM

## 2020-04-09 DIAGNOSIS — M7712 Lateral epicondylitis, left elbow: Secondary | ICD-10-CM | POA: Insufficient documentation

## 2020-04-09 DIAGNOSIS — R4189 Other symptoms and signs involving cognitive functions and awareness: Secondary | ICD-10-CM

## 2020-04-09 NOTE — Progress Notes (Signed)
    SUBJECTIVE:   CHIEF COMPLAINT / HPI:   A Nepali interpreter was used throughout this encounter  Left Elbow pain  Patient reports tenderness and pain in left elbow for approximately 6 months, especially when he moves it.  It is tender to palpation on the lateral aspect.  It does not swell or become erythematous.  He has not tried any medications or treatments for this pain.  Patient reports it especially hurts when he has to repeatedly flex and then extend his arm.  Short term memory concerns Patient reports he has had short-term memory concerns for the last 4 months.  He feels like it may be getting worse so he wanted to be evaluated for this and was hoping to get some type of medication for this.  Patient reports that he will make a list of things to do but then will forget to do them or forget what he was going to do.  He denies any issues with his bowel or bladder continence, denies any weakness.  Denies forgetting where he is or when it is.  He just forgets tasks.   OBJECTIVE:   BP 128/84   Pulse 95   Ht 5\' 7"  (1.702 m)   Wt 169 lb (76.7 kg)   SpO2 98%   BMI 26.47 kg/m   General: Well-appearing 55 year old male in no acute distress using Nepali interpreter Cardiac: Regular rate and rhythm, no murmurs appreciated Respiratory: Normal work of breathing, lungs clear to auscultation bilaterally Neuro: Cranial nerves intact, no weakness appreciated Psych: Alert and oriented to person and place MSK: tenderness to palpation of lateral epicondyl, no edema or erythema appreciated  ASSESSMENT/PLAN:   Left lateral epicondylitis Patient with 82-month history of left elbow pain consistent with left epicondylitis.  Provided rehab instructions and instructed to take Tylenol as needed for the pain.  If this does not improve consider sending him to sports medicine.  Strict return precautions given.  Subjective cognitive impairment Patient with subjective memory concerns over the last 4  months.  Does not lose orientation but forgets to do things.  No red flag symptoms appreciated.  We will check a TSH, RPR, vitamin B12 and we will follow-up in 1 month regarding further concerns about his memory.  I will call the patient with any abnormal findings from his lab work.  Strict ED and return precautions given.     8-month, MD [facility]

## 2020-04-09 NOTE — Patient Instructions (Addendum)
It was great seeing you today.  Regarding your elbow I think you have what is called tennis elbow and below is some information on this as well as treatment for it.  I would also like you to take Tylenol 500 mg up to 4 times a day.  Regarding your memory concerns we going to collect some lab work and I will let you know if there are any abnormalities.  Some of this may lead to memory issues.  If you have any worsening symptoms or concerns please feel free to call the clinic.  Happy of wonderful afternoon!   Tennis Elbow  Tennis elbow is irritation and swelling (inflammation) in your outer forearm, near your elbow. Swelling affects the tissues that connect muscle to bone (tendons). Tennis elbow can happen playing any sport or doing any job where you use your elbow too much. It is caused by doing the same motion over and over. What are the causes? This condition is often caused by playing sports or doing work where you need to keep moving your forearm the same way. Sometimes, it may be caused by a sudden injury. What increases the risk? You are more likely to get tennis elbow if you play tennis or another racket sport. You also have a higher risk if you often use your hands for work. This includes:  People who use computers.  Holiday representative workers.  People who work in a factory.  Musicians.  Cooks.  Cashiers. What are the signs or symptoms?  Pain and tenderness in your forearm and the outer part of your elbow. You may have pain all the time or only when you use your arm.  A burning feeling. This starts in your elbow and spreads down your arm.  A weak grip in your hand. How is this treated? Resting and icing your arm is often the first treatment. Your doctor may also recommend:  Medicines to reduce pain and swelling.  An elbow strap.  Physical therapy. This may include massage or exercises or both.  An elbow brace. If these do not help your symptoms get better, your doctor may  recommend surgery. Follow these instructions at home: If you have a brace or strap:  Wear the brace or strap as told by your doctor. Take it off only as told by your doctor.  Check the skin around the brace or strap every day. Tell your doctor if you see problems.  Loosen it if your fingers: ? Tingle. ? Become numb. ? Turn cold and blue.  Keep the brace or strap clean.  If the brace or strap is not waterproof: ? Do not let it get wet. ? Cover it with a watertight covering when you take a bath or a shower. Managing pain, stiffness, and swelling  If told, put ice on the injured area. To do this: ? If you have a removable brace or strap, take it off as told by your doctor. ? Put ice in a plastic bag. ? Place a towel between your skin and the bag. ? Leave the ice on for 20 minutes, 2-3 times a day. ? Take off the ice if your skin turns bright red. This is very important. If you cannot feel pain, heat, or cold, you have a greater risk of damage to the area.  Move your fingers often.   Activity  Rest your elbow and wrist. Avoid activities that can cause elbow problems as told by your doctor.  Do exercises as told by your  doctor.  If you lift an object, lift it with your palm facing up. Lifestyle  If your tennis elbow is caused by sports, check your equipment and make sure that: ? You are using it the right way. ? It fits you well.  If your tennis elbow is caused by work or computer use, take breaks often to stretch your arm. Talk with your manager about how you can make your condition better at work. General instructions  Take over-the-counter and prescription medicines only as told by your doctor.  Do not smoke or use any products that contain nicotine or tobacco. If you need help quitting, ask your doctor.  Keep all follow-up visits. How is this prevented?  Before and after being active: ? Warm up and stretch before being active. ? Cool down and stretch after being  active. ? Give your body time to rest between activities.  While being active: ? Make sure to use equipment that fits you. ? If you play tennis, put power in your stroke with your lower body. Avoid using your arm only.  Maintain physical fitness. This includes: ? Strength. ? Flexibility. ? Endurance.  Do exercises to strengthen the forearm muscles. Contact a doctor if:  Your pain does not get better with treatment.  Your pain gets worse.  You have weakness in your forearm, hand, or fingers.  You cannot feel your forearm, hand, or fingers. Get help right away if:  Your pain is very bad.  You cannot move your wrist. Summary  Tennis elbow is irritation and swelling (inflammation) in your outer forearm, near your elbow.  Tennis elbow is caused by doing the same motion over and over.  Rest your elbow and wrist. Avoid activities as told by your doctor.  If told, put ice on the injured area for 20 minutes, 2-3 times a day. This information is not intended to replace advice given to you by your health care provider. Make sure you discuss any questions you have with your health care provider. Document Revised: 07/08/2019 Document Reviewed: 07/08/2019 Elsevier Patient Education  2021 [employer]   Tennis Elbow Rehab Ask your health care provider which exercises are safe for you. Do exercises exactly as told by your health care provider and adjust them as directed. It is normal to feel mild stretching, pulling, tightness, or discomfort as you do these exercises. Stop right away if you feel sudden pain or your pain gets worse. Do not begin these exercises until told by your health care provider. Stretching and range-of-motion exercises These exercises warm up your muscles and joints and improve the movement and flexibility of your elbow. Wrist flexion, assisted 1. Straighten your left / right elbow in front of you with your palm facing down toward the floor. ? If told by your  health care provider, bend your left / right elbow to a 90-degree angle (right angle) at your side instead of holding it straight. 2. With your other hand, gently push over the back of your left / right hand so your fingers point toward the floor (flexion). Stop when you feel a gentle stretch on the back of your forearm. 3. Hold this position for __________ seconds. Repeat __________ times. Complete this exercise __________ times a day.   Wrist extension, assisted 1. Straighten your left / right elbow in front of you with your palm facing up toward the ceiling. ? If told by your health care provider, bend your left / right elbow to a 90-degree angle (right  angle) at your side instead of holding it straight. 2. With your other hand, gently pull your left / right hand and fingers toward the floor (extension). Stop when you feel a gentle stretch on the palm side of your forearm. 3. Hold this position for __________ seconds. Repeat __________ times. Complete this exercise __________ times a day.   Assisted forearm rotation, supination 1. Sit or stand with your elbows at your side. 2. Bend your left / right elbow to a 90-degree angle (right angle). 3. Using your uninjured hand, turn your left / right palm up toward the ceiling (supination) until you feel a gentle stretch along the inside of your forearm. 4. Hold this position for __________ seconds. Repeat __________ times. Complete this exercise __________ times a day. Assisted forearm rotation, pronation 1. Sit or stand with your elbows at your side. 2. Bend your left / right elbow to a 90-degree angle (right angle). 3. Using your uninjured hand, turn your left / right palm down toward the floor (pronation) until you feel a gentle stretch along the outside of your forearm. 4. Hold this position for __________ seconds. Repeat __________ times. Complete this exercise __________ times a day. Strengthening exercises These exercises build strength and  endurance in your forearm and elbow. Endurance is the ability to use your muscles for a long time, even after they get tired. Radial deviation 1. Stand with a __________ weight or a hammer in your left / right hand. Or, sit while holding a rubber exercise band or tubing, with your left / right forearm supported on a table or countertop. ? Position your forearm so that the thumb is facing the ceiling, as if you are going to clap your hands. This is the neutral position. 2. Raise your hand upward in front of you so your thumb moves toward the ceiling (radial deviation), or pull up on the rubber tubing. Keep your forearm and elbow still while you move your wrist only. 3. Hold this position for __________ seconds. 4. Slowly return to the starting position. Repeat __________ times. Complete this exercise __________ times a day.   Wrist extension, eccentric 1. Sit with your left / right forearm palm-down and supported on a table or other surface. Let your left / right wrist extend over the edge of the surface. 2. Hold a __________ weight or a piece of exercise band or tubing in your left / right hand. ? If using a rubber exercise band or tubing, hold the other end of the tubing with your other hand. 3. Use your uninjured hand to move your left / right hand up toward the ceiling. 4. Take your uninjured hand away and slowly return to the starting position using only your left / right hand. Lowering your arm under tension is called eccentric extension. Repeat __________ times. Complete this exercise __________ times a day. Wrist extension Do not do this exercise if it causes pain at the outside of your elbow. Only do this exercise once instructed by your health care provider. 1. Sit with your left / right forearm supported on a table or other surface and your palm turned down toward the floor. Let your left / right wrist extend over the edge of the surface. 2. Hold a __________ weight or a piece of rubber  exercise band or tubing. ? If you are using a rubber exercise band or tubing, hold the band or tubing in place with your other hand to provide resistance. 3. Slowly bend your wrist so  your hand moves up toward the ceiling (extension). Move only your wrist, keeping your forearm and elbow still. 4. Hold this position for __________ seconds. 5. Slowly return to the starting position. Repeat __________ times. Complete this exercise __________ times a day.   Forearm rotation, supination To do this exercise, you will need a lightweight hammer or rubber mallet. 1. Sit with your left / right forearm supported on a table or other surface. Bend your elbow to a 90-degree angle (right angle). Position your forearm so that your palm is facing down toward the floor, with your hand resting over the edge of the table. 2. Hold a hammer in your left / right hand. ? To make this exercise easier, hold the hammer near the head of the hammer. ? To make this exercise harder, hold the hammer near the end of the handle. 3. Without moving your wrist or elbow, slowly rotate your forearm so your palm faces up toward the ceiling (supination). 4. Hold this position for __________ seconds. 5. Slowly return to the starting position. Repeat __________ times. Complete this exercise __________ times a day.   Shoulder blade squeeze 1. Sit in a stable chair or stand with good posture. If you are sitting down, do not let your back touch the back of the chair. 2. Your arms should be at your sides with your elbows bent to a 90-degree angle (right angle). Position your forearms so that your thumbs are facing the ceiling (neutral position). 3. Without lifting your shoulders up, squeeze your shoulder blades tightly together. 4. Hold this position for __________ seconds. 5. Slowly release and return to the starting position. Repeat __________ times. Complete this exercise __________ times a day. This information is not intended to  replace advice given to you by your health care provider. Make sure you discuss any questions you have with your health care provider. Document Revised: 03/19/2019 Document Reviewed: 03/19/2019 Elsevier Patient Education  2021 [employer]

## 2020-04-09 NOTE — Assessment & Plan Note (Signed)
Patient with subjective memory concerns over the last 4 months.  Does not lose orientation but forgets to do things.  No red flag symptoms appreciated.  We will check a TSH, RPR, vitamin B12 and we will follow-up in 1 month regarding further concerns about his memory.  I will call the patient with any abnormal findings from his lab work.  Strict ED and return precautions given.

## 2020-04-09 NOTE — Assessment & Plan Note (Signed)
Patient with 66-month history of left elbow pain consistent with left epicondylitis.  Provided rehab instructions and instructed to take Tylenol as needed for the pain.  If this does not improve consider sending him to sports medicine.  Strict return precautions given.

## 2020-04-10 LAB — HCV AB W REFLEX TO QUANT PCR: HCV Ab: <0.1 s/co ratio (ref 0.0–0.9)

## 2020-04-10 LAB — TSH: TSH: 0.595 u[IU]/mL (ref 0.450–4.500)

## 2020-04-10 LAB — HIV ANTIBODY (ROUTINE TESTING W REFLEX): HIV Screen 4th Generation wRfx: NONREACTIVE

## 2020-04-13 LAB — VITAMIN B12: Vitamin B-12: 1006 pg/mL (ref 232–1245)

## 2020-04-13 LAB — HCV INTERPRETATION

## 2020-04-13 LAB — RPR: RPR Ser Ql: NONREACTIVE

## 2020-07-13 ENCOUNTER — Ambulatory Visit
Admission: RE | Admit: 2020-07-13 | Discharge: 2020-07-13 | Disposition: A | Discharge status: Home / Self Care | Payer: 59 | Source: Ambulatory Visit | Attending: Chiropractic Medicine | Admitting: Chiropractic Medicine

## 2020-07-13 ENCOUNTER — Other Ambulatory Visit: Payer: Self-pay

## 2020-07-13 ENCOUNTER — Other Ambulatory Visit: Payer: Self-pay | Admitting: Chiropractic Medicine

## 2020-07-13 DIAGNOSIS — R52 Pain, unspecified: Secondary | ICD-10-CM

## 2021-04-12 ENCOUNTER — Encounter: Payer: Medicaid Other | Admitting: Family Medicine

## 2022-12-05 IMAGING — CR DG LUMBAR SPINE COMPLETE 4+V
5 series · 5 of 5 positions shown · non-contrast
Comparison: Lumbar spine radiographs 11/29/2009

CLINICAL DATA: Constant midline low back pain for 4 days.

No known injury.
EXAM:
THORACIC SPINE - 3 VIEWS; LUMBAR SPINE - COMPLETE 4+ VIEW

[w lumbar spine ap]
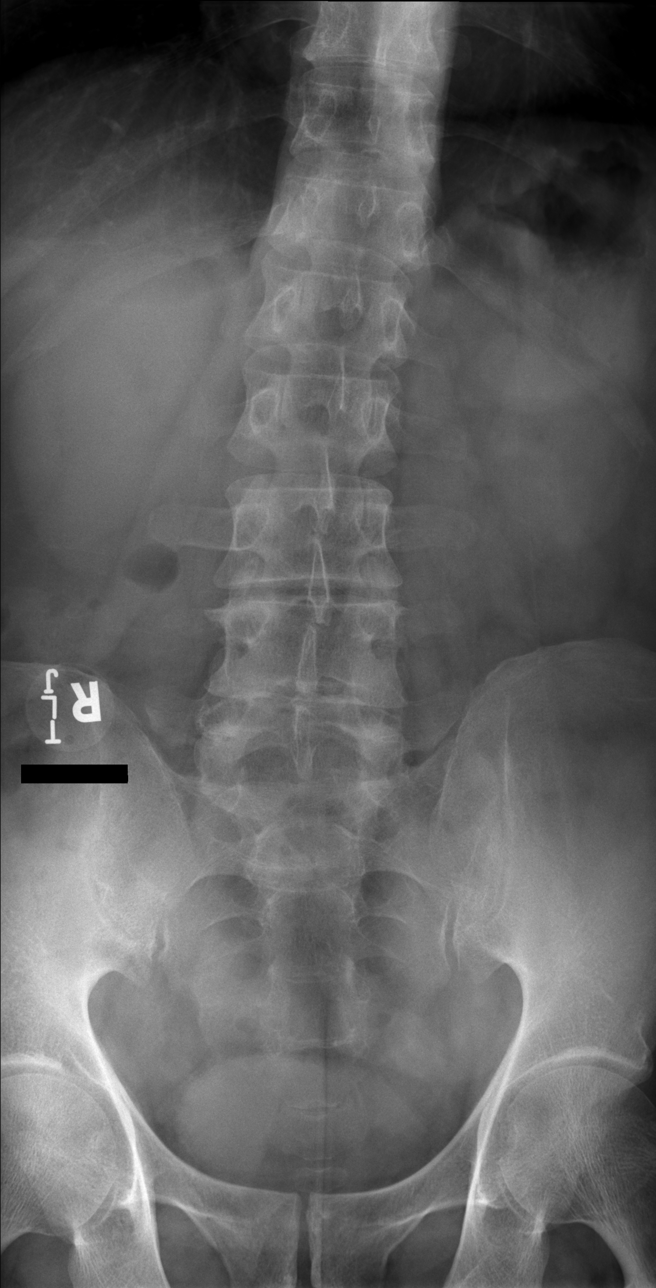

[w lumbar spine obl (1 of 2)]
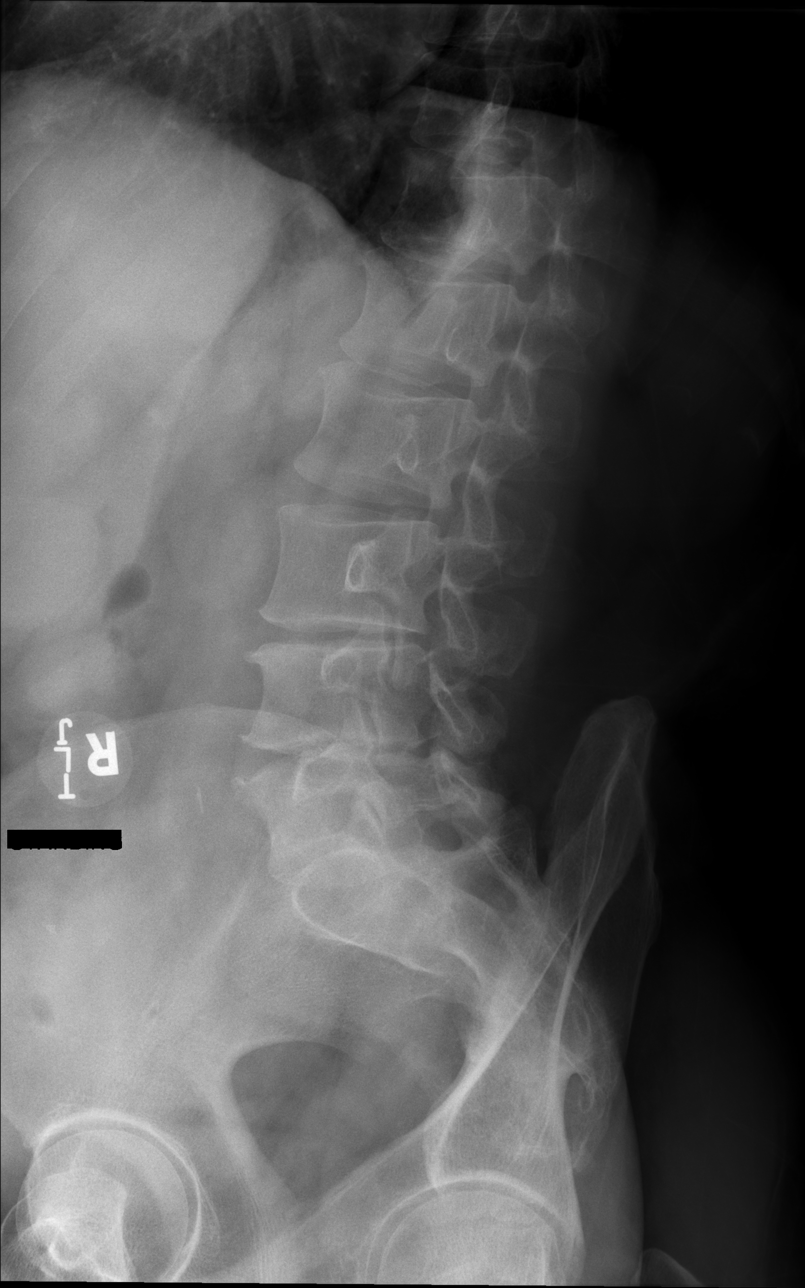

[w lumbar spine obl (2 of 2)]
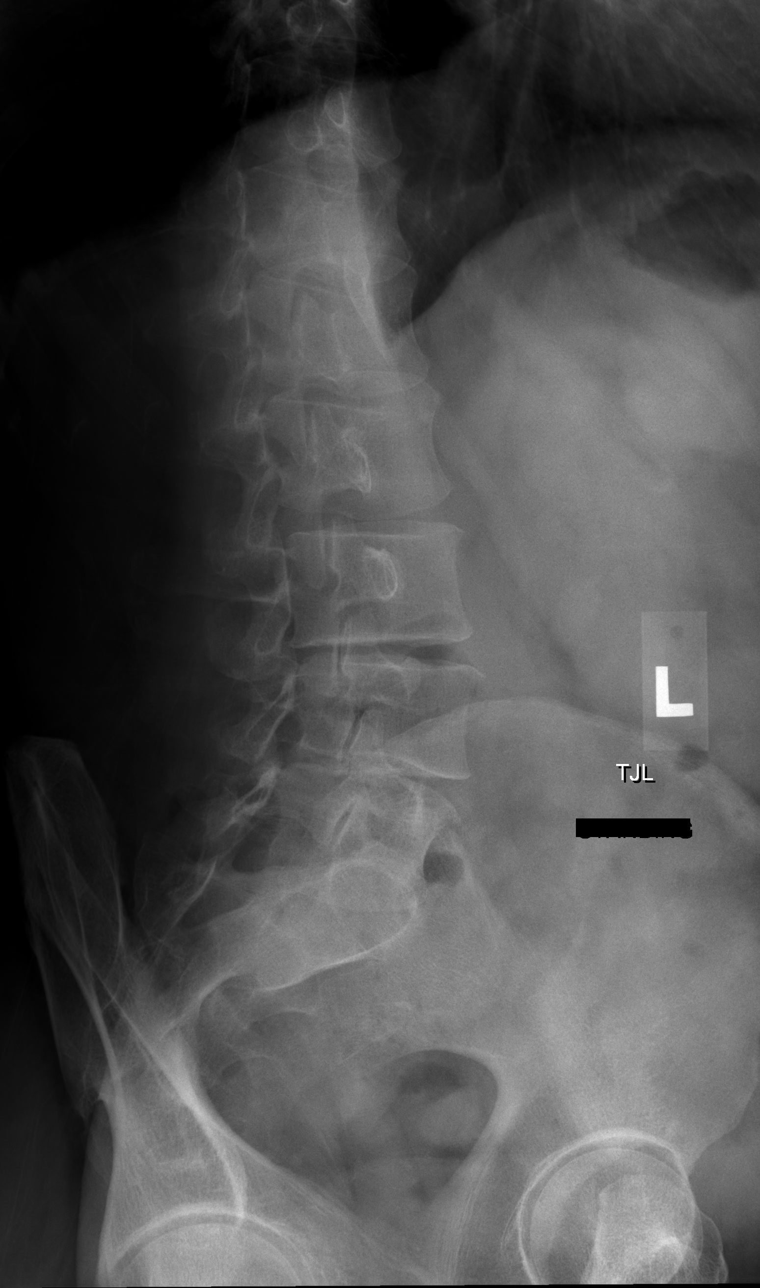

[w lumbar spine lat]
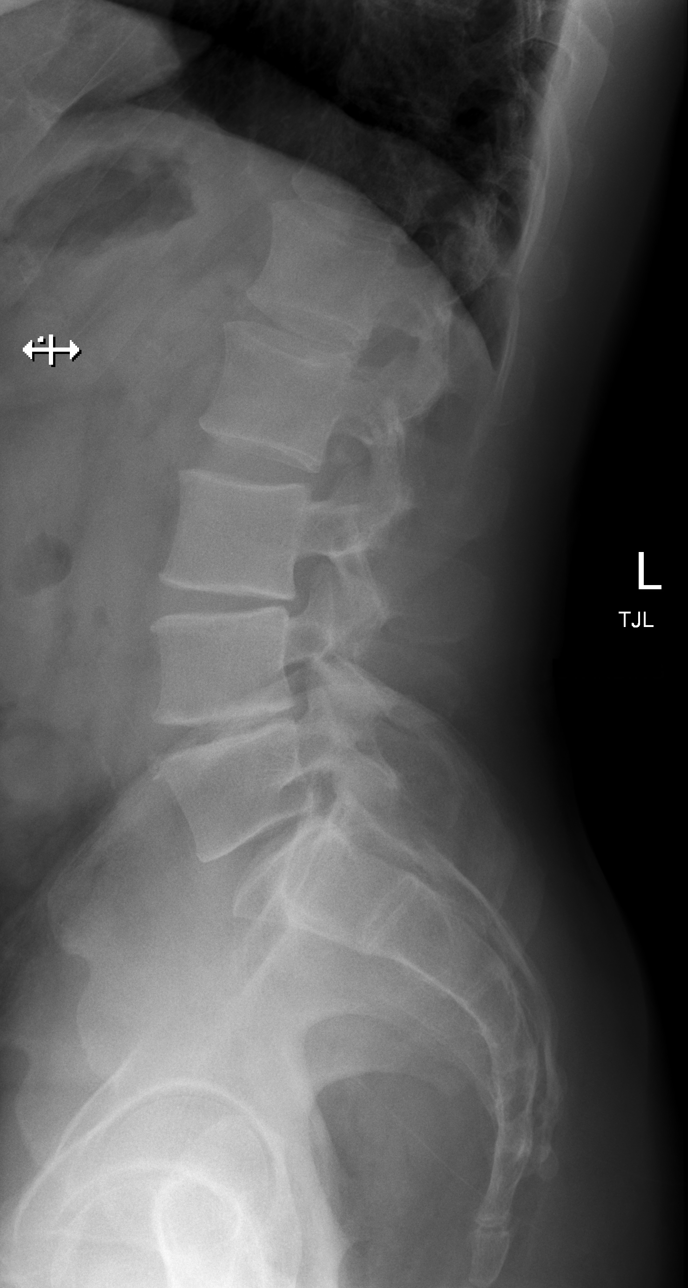

[w lumbar l-5 s-1 spot]
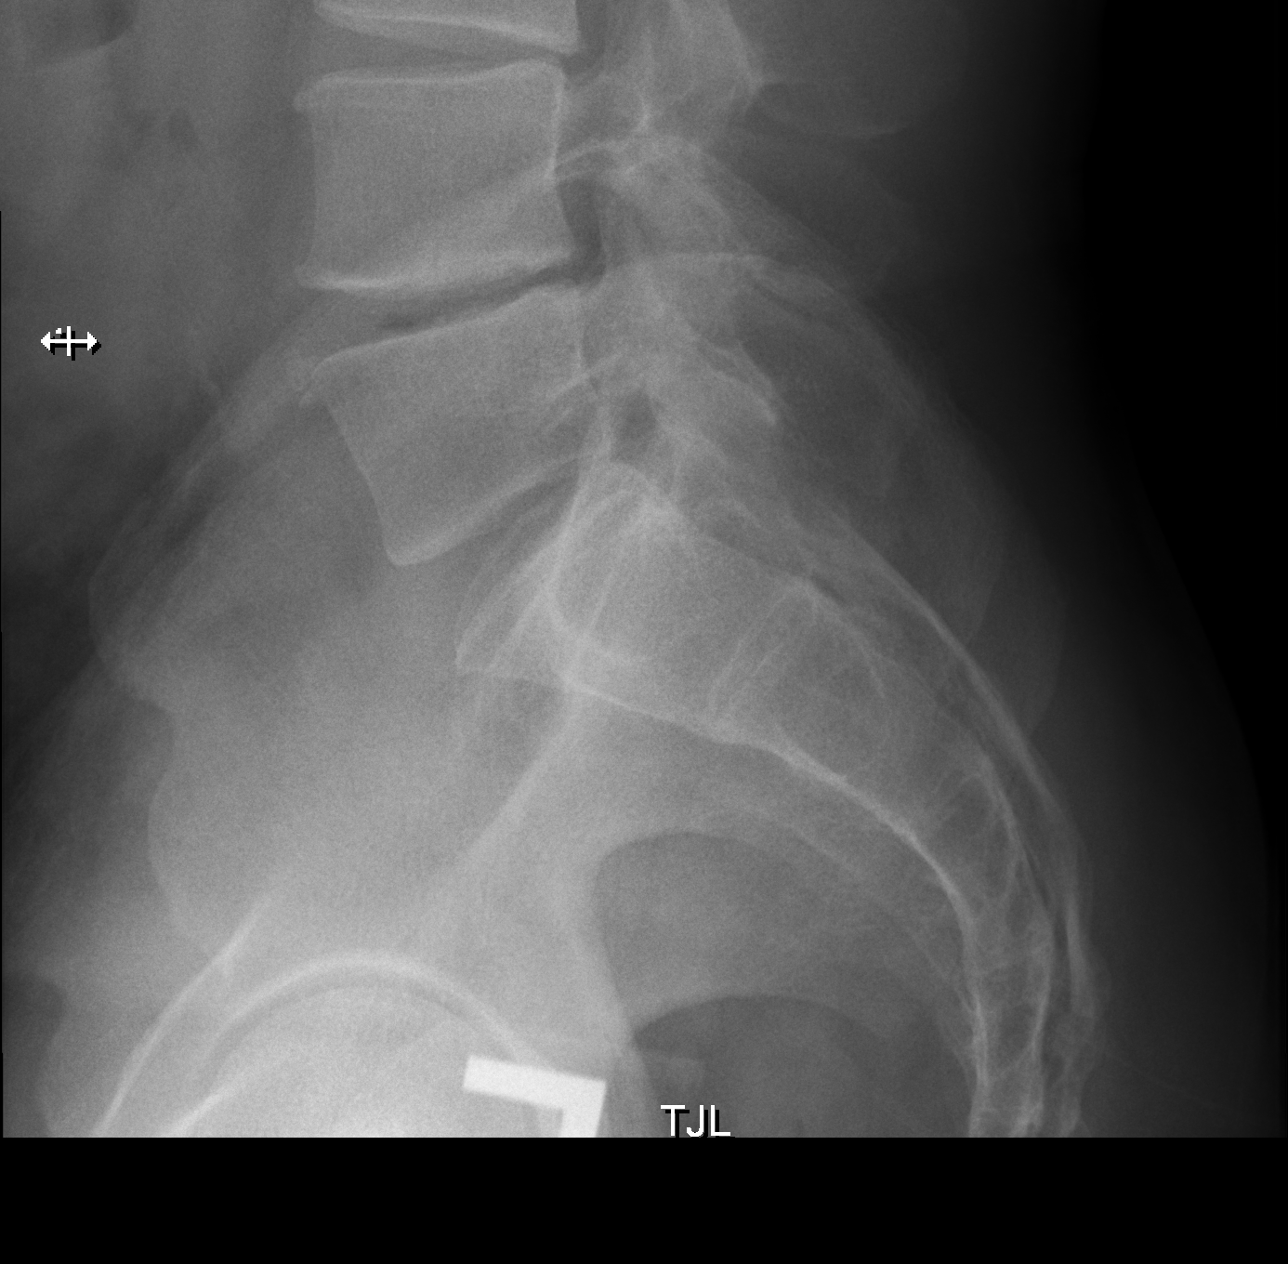

[5 of 5 positions shown; findings below may reference images not displayed]

FINDINGS: Thoracic spine: Levoconvex curvature of the thoracic spine. No
listhesis. No significant disc or vertebral body height loss.

Lumbar spine: Alignment is within normal limits. Vertebral body
heights maintained. Minimal disc height loss at L4-L5 with minimal
endplate degenerative changes.
IMPRESSION: No acute abnormality of the thoracic or lumbar spine.

## 2023-01-21 DIAGNOSIS — Z419 Encounter for procedure for purposes other than remedying health state, unspecified: Secondary | ICD-10-CM | POA: Diagnosis not present

## 2023-02-10 DIAGNOSIS — Z419 Encounter for procedure for purposes other than remedying health state, unspecified: Secondary | ICD-10-CM | POA: Diagnosis not present

## 2023-03-10 DIAGNOSIS — Z419 Encounter for procedure for purposes other than remedying health state, unspecified: Secondary | ICD-10-CM | POA: Diagnosis not present

## 2023-04-21 DIAGNOSIS — Z419 Encounter for procedure for purposes other than remedying health state, unspecified: Secondary | ICD-10-CM | POA: Diagnosis not present

## 2023-05-21 DIAGNOSIS — Z419 Encounter for procedure for purposes other than remedying health state, unspecified: Secondary | ICD-10-CM | POA: Diagnosis not present

## 2023-06-21 DIAGNOSIS — Z419 Encounter for procedure for purposes other than remedying health state, unspecified: Secondary | ICD-10-CM | POA: Diagnosis not present

## 2023-07-21 DIAGNOSIS — Z419 Encounter for procedure for purposes other than remedying health state, unspecified: Secondary | ICD-10-CM | POA: Diagnosis not present

## 2023-07-26 DIAGNOSIS — R21 Rash and other nonspecific skin eruption: Secondary | ICD-10-CM | POA: Diagnosis not present

## 2023-07-26 DIAGNOSIS — L500 Allergic urticaria: Secondary | ICD-10-CM | POA: Diagnosis not present

## 2023-07-29 DIAGNOSIS — L500 Allergic urticaria: Secondary | ICD-10-CM | POA: Diagnosis not present

## 2023-08-21 DIAGNOSIS — Z419 Encounter for procedure for purposes other than remedying health state, unspecified: Secondary | ICD-10-CM | POA: Diagnosis not present

## 2023-09-21 DIAGNOSIS — Z419 Encounter for procedure for purposes other than remedying health state, unspecified: Secondary | ICD-10-CM | POA: Diagnosis not present

## 2023-10-21 DIAGNOSIS — Z419 Encounter for procedure for purposes other than remedying health state, unspecified: Secondary | ICD-10-CM | POA: Diagnosis not present
